# Patient Record
Sex: Male | Born: 1935 | Race: White | Hispanic: No | Marital: Married
Health system: Southern US, Community
[De-identification: ages and names within clinical notes are randomized; demographics above are authoritative.]

---

## 2004-08-03 ENCOUNTER — Ambulatory Visit: Payer: Self-pay | Admitting: Unknown Physician Specialty

## 2007-05-28 ENCOUNTER — Ambulatory Visit: Payer: Self-pay | Admitting: Unknown Physician Specialty

## 2010-11-06 ENCOUNTER — Inpatient Hospital Stay: Payer: Self-pay | Admitting: Internal Medicine

## 2013-03-05 ENCOUNTER — Ambulatory Visit: Payer: Self-pay | Admitting: Family Medicine

## 2013-03-05 LAB — URINALYSIS, COMPLETE
Bilirubin,UR: NEGATIVE
Ketone: NEGATIVE
Nitrite: NEGATIVE
Ph: 6.5 (ref 4.5–8.0)
Protein: 300
Specific Gravity: 1.02 (ref 1.003–1.030)

## 2013-03-05 LAB — COMPREHENSIVE METABOLIC PANEL
Albumin: 2.9 g/dL — ABNORMAL LOW (ref 3.4–5.0)
Alkaline Phosphatase: 71 U/L (ref 50–136)
Anion Gap: 10 (ref 7–16)
BUN: 19 mg/dL — ABNORMAL HIGH (ref 7–18)
Bilirubin,Total: 0.5 mg/dL (ref 0.2–1.0)
Calcium, Total: 8.9 mg/dL (ref 8.5–10.1)
Chloride: 104 mmol/L (ref 98–107)
Co2: 25 mmol/L (ref 21–32)
Creatinine: 1.17 mg/dL (ref 0.60–1.30)
EGFR (African American): >60
EGFR (Non-African Amer.): 56 — ABNORMAL LOW
Glucose: 415 mg/dL — ABNORMAL HIGH (ref 65–99)
Osmolality: 297 (ref 275–301)
Potassium: 4 mmol/L (ref 3.5–5.1)
SGOT(AST): 13 U/L — ABNORMAL LOW (ref 15–37)
SGPT (ALT): 19 U/L (ref 12–78)
Sodium: 139 mmol/L (ref 136–145)
Total Protein: 5.8 g/dL — ABNORMAL LOW (ref 6.4–8.2)

## 2013-03-05 LAB — CBC WITH DIFFERENTIAL/PLATELET
Basophil #: 0 10*3/uL (ref 0.0–0.1)
Basophil %: 0.9 %
Eosinophil #: 0.1 10*3/uL (ref 0.0–0.7)
Eosinophil %: 2.2 %
HCT: 32.2 % — ABNORMAL LOW (ref 40.0–52.0)
HGB: 10.8 g/dL — ABNORMAL LOW (ref 13.0–18.0)
Lymphocyte #: 0.7 10*3/uL — ABNORMAL LOW (ref 1.0–3.6)
Lymphocyte %: 12.8 %
MCH: 29.8 pg (ref 26.0–34.0)
MCHC: 33.4 g/dL (ref 32.0–36.0)
MCV: 89 fL (ref 80–100)
Monocyte #: 0.4 x10 3/mm (ref 0.2–1.0)
Monocyte %: 8.3 %
Neutrophil #: 3.9 10*3/uL (ref 1.4–6.5)
Neutrophil %: 75.8 %
Platelet: 238 10*3/uL (ref 150–440)
RBC: 3.61 10*6/uL — ABNORMAL LOW (ref 4.40–5.90)
RDW: 15.8 % — ABNORMAL HIGH (ref 11.5–14.5)
WBC: 5.2 10*3/uL (ref 3.8–10.6)

## 2013-03-07 ENCOUNTER — Ambulatory Visit: Payer: Self-pay | Admitting: Emergency Medicine

## 2013-03-07 LAB — URINE CULTURE

## 2013-03-07 LAB — URINALYSIS, COMPLETE
Bilirubin,UR: NEGATIVE
Nitrite: NEGATIVE

## 2013-03-08 ENCOUNTER — Ambulatory Visit: Payer: Self-pay

## 2013-03-09 LAB — URINE CULTURE

## 2013-04-30 ENCOUNTER — Inpatient Hospital Stay: Payer: Self-pay | Admitting: Internal Medicine

## 2013-04-30 ENCOUNTER — Ambulatory Visit: Payer: Self-pay | Admitting: Family Medicine

## 2013-04-30 LAB — BASIC METABOLIC PANEL
Anion Gap: 19 — ABNORMAL HIGH (ref 7–16)
Anion Gap: 14 (ref 7–16)
BUN: 39 mg/dL — ABNORMAL HIGH (ref 7–18)
BUN: 35 mg/dL — ABNORMAL HIGH (ref 7–18)
Chloride: 95 mmol/L — ABNORMAL LOW (ref 98–107)
Chloride: 103 mmol/L (ref 98–107)
Co2: 18 mmol/L — ABNORMAL LOW (ref 21–32)
Co2: 19 mmol/L — ABNORMAL LOW (ref 21–32)
Creatinine: 1.54 mg/dL — ABNORMAL HIGH (ref 0.60–1.30)
Creatinine: 1.07 mg/dL (ref 0.60–1.30)
EGFR (African American): 47 — ABNORMAL LOW
EGFR (Non-African Amer.): 40 — ABNORMAL LOW
EGFR (Non-African Amer.): >60
Glucose: 637 mg/dL (ref 65–99)
Glucose: 328 mg/dL — ABNORMAL HIGH (ref 65–99)
Osmolality: 304 (ref 275–301)
Osmolality: 293 (ref 275–301)
Potassium: 5.5 mmol/L — ABNORMAL HIGH (ref 3.5–5.1)
Potassium: 4.1 mmol/L (ref 3.5–5.1)
Sodium: 132 mmol/L — ABNORMAL LOW (ref 136–145)
Sodium: 136 mmol/L (ref 136–145)

## 2013-04-30 LAB — CBC WITH DIFFERENTIAL/PLATELET
Basophil #: 0 10*3/uL (ref 0.0–0.1)
Eosinophil %: 0.1 %
HCT: 41.6 % (ref 40.0–52.0)
HGB: 13 g/dL (ref 13.0–18.0)
Lymphocyte #: 0.6 10*3/uL — ABNORMAL LOW (ref 1.0–3.6)
MCH: 28.5 pg (ref 26.0–34.0)
MCHC: 31.3 g/dL — ABNORMAL LOW (ref 32.0–36.0)
MCV: 91 fL (ref 80–100)
Monocyte #: 0.7 x10 3/mm (ref 0.2–1.0)
Platelet: 266 10*3/uL (ref 150–440)
RBC: 4.57 10*6/uL (ref 4.40–5.90)
RDW: 15.5 % — ABNORMAL HIGH (ref 11.5–14.5)
WBC: 6.4 10*3/uL (ref 3.8–10.6)

## 2013-04-30 LAB — URINALYSIS, COMPLETE
Bilirubin,UR: NEGATIVE
Nitrite: NEGATIVE
Protein: 30
RBC,UR: >30 /HPF (ref 0–5)
Specific Gravity: 1.015 (ref 1.003–1.030)

## 2013-04-30 LAB — CK TOTAL AND CKMB (NOT AT ARMC)
CK, Total: 27 U/L — ABNORMAL LOW (ref 35–232)
CK-MB: 1.9 ng/mL (ref 0.5–3.6)

## 2013-05-01 LAB — CBC WITH DIFFERENTIAL/PLATELET
Basophil %: 0.3 %
Eosinophil %: 1 %
MCHC: 34 g/dL (ref 32.0–36.0)
MCV: 86 fL (ref 80–100)
Monocyte #: 0.7 x10 3/mm (ref 0.2–1.0)
Monocyte %: 11.1 %
Neutrophil %: 78.7 %
Platelet: 260 10*3/uL (ref 150–440)
RBC: 3.88 10*6/uL — ABNORMAL LOW (ref 4.40–5.90)
RDW: 14.9 % — ABNORMAL HIGH (ref 11.5–14.5)
WBC: 6.4 10*3/uL (ref 3.8–10.6)

## 2013-05-01 LAB — BASIC METABOLIC PANEL
BUN: 31 mg/dL — ABNORMAL HIGH (ref 7–18)
Calcium, Total: 8.6 mg/dL (ref 8.5–10.1)
Chloride: 107 mmol/L (ref 98–107)
Co2: 27 mmol/L (ref 21–32)
Creatinine: 1.04 mg/dL (ref 0.60–1.30)
EGFR (African American): >60
EGFR (Non-African Amer.): >60
Osmolality: 289 (ref 275–301)
Sodium: 140 mmol/L (ref 136–145)

## 2013-05-01 LAB — MAGNESIUM: Magnesium: 1.8 mg/dL

## 2013-05-01 LAB — LIPID PANEL
Cholesterol: 138 mg/dL (ref 0–200)
Ldl Cholesterol, Calc: 83 mg/dL (ref 0–100)

## 2013-05-01 LAB — TSH: Thyroid Stimulating Horm: 1.12 u[IU]/mL

## 2013-05-01 LAB — HEMOGLOBIN A1C: Hemoglobin A1C: 13.5 % — ABNORMAL HIGH (ref 4.2–6.3)

## 2013-05-02 LAB — URINALYSIS, COMPLETE
Bilirubin,UR: NEGATIVE
Glucose,UR: 150 mg/dL (ref 0–75)
Nitrite: NEGATIVE
Ph: 6 (ref 4.5–8.0)
Protein: 100
RBC,UR: 388 /HPF (ref 0–5)
Specific Gravity: 1.014 (ref 1.003–1.030)
Squamous Epithelial: NONE SEEN
WBC UR: 13476 /HPF (ref 0–5)

## 2013-05-04 LAB — URINE CULTURE

## 2013-05-05 ENCOUNTER — Inpatient Hospital Stay: Payer: Self-pay | Admitting: Internal Medicine

## 2013-05-05 LAB — CK-MB: CK-MB: 12.8 ng/mL — ABNORMAL HIGH (ref 0.5–3.6)

## 2013-05-05 LAB — URINALYSIS, COMPLETE
Bacteria: NONE SEEN
Glucose,UR: 150 mg/dL (ref 0–75)
RBC,UR: 368 /HPF (ref 0–5)
Specific Gravity: 1.008 (ref 1.003–1.030)

## 2013-05-05 LAB — TROPONIN I: Troponin-I: 0.08 ng/mL — ABNORMAL HIGH

## 2013-05-05 LAB — COMPREHENSIVE METABOLIC PANEL
Albumin: 2.3 g/dL — ABNORMAL LOW (ref 3.4–5.0)
Alkaline Phosphatase: 85 U/L (ref 50–136)
BUN: 19 mg/dL — ABNORMAL HIGH (ref 7–18)
Bilirubin,Total: 0.4 mg/dL (ref 0.2–1.0)
Calcium, Total: 8.9 mg/dL (ref 8.5–10.1)
Chloride: 99 mmol/L (ref 98–107)
Creatinine: 0.67 mg/dL (ref 0.60–1.30)
EGFR (African American): >60
EGFR (Non-African Amer.): >60
Glucose: 158 mg/dL — ABNORMAL HIGH (ref 65–99)
Total Protein: 5.8 g/dL — ABNORMAL LOW (ref 6.4–8.2)

## 2013-05-05 LAB — CBC
HGB: 12.4 g/dL — ABNORMAL LOW (ref 13.0–18.0)
MCV: 84 fL (ref 80–100)
Platelet: 235 10*3/uL (ref 150–440)
RBC: 4.43 10*6/uL (ref 4.40–5.90)
RDW: 15.5 % — ABNORMAL HIGH (ref 11.5–14.5)

## 2013-05-05 LAB — CK TOTAL AND CKMB (NOT AT ARMC)
CK, Total: 614 U/L — ABNORMAL HIGH (ref 35–232)
CK-MB: 7.4 ng/mL — ABNORMAL HIGH (ref 0.5–3.6)

## 2013-05-06 LAB — TROPONIN I: Troponin-I: 0.1 ng/mL — ABNORMAL HIGH

## 2013-05-06 LAB — CBC WITH DIFFERENTIAL/PLATELET
Basophil #: 0 10*3/uL (ref 0.0–0.1)
Basophil %: 0.5 %
Eosinophil #: 0 10*3/uL (ref 0.0–0.7)
HCT: 30.1 % — ABNORMAL LOW (ref 40.0–52.0)
HGB: 10.1 g/dL — ABNORMAL LOW (ref 13.0–18.0)
Lymphocyte #: 0.8 10*3/uL — ABNORMAL LOW (ref 1.0–3.6)
Lymphocyte %: 14.1 %
MCHC: 33.5 g/dL (ref 32.0–36.0)
MCV: 85 fL (ref 80–100)
Monocyte #: 0.6 x10 3/mm (ref 0.2–1.0)
Monocyte %: 11.9 %
Neutrophil #: 4 10*3/uL (ref 1.4–6.5)
Platelet: 193 10*3/uL (ref 150–440)
RBC: 3.57 10*6/uL — ABNORMAL LOW (ref 4.40–5.90)
WBC: 5.5 10*3/uL (ref 3.8–10.6)

## 2013-05-06 LAB — CK-MB: CK-MB: 4.7 ng/mL — ABNORMAL HIGH (ref 0.5–3.6)

## 2013-05-06 LAB — BASIC METABOLIC PANEL
Anion Gap: 6 — ABNORMAL LOW (ref 7–16)
BUN: 16 mg/dL (ref 7–18)
Calcium, Total: 8.2 mg/dL — ABNORMAL LOW (ref 8.5–10.1)
Chloride: 104 mmol/L (ref 98–107)
Creatinine: 0.74 mg/dL (ref 0.60–1.30)
EGFR (African American): >60
Glucose: 111 mg/dL — ABNORMAL HIGH (ref 65–99)

## 2013-05-07 LAB — URINE CULTURE

## 2013-05-10 LAB — CULTURE, BLOOD (SINGLE)

## 2013-06-01 ENCOUNTER — Ambulatory Visit: Payer: Self-pay | Admitting: Urology

## 2013-06-04 ENCOUNTER — Ambulatory Visit: Payer: Self-pay | Admitting: Urology

## 2013-09-09 IMAGING — CT CT HEAD WITHOUT CONTRAST
2 of 3 series · 16 of 30 positions shown, 18 images · non-contrast
Comparison: none

REASON FOR EXAM: syncope ha
COMMENTS:

[Series 4: soft tissue 2 · axial · 0.40mm/px · z∈[+582,+711]mm · 8 of 34 slices shown, 10 images]
[im 4/34  brain]
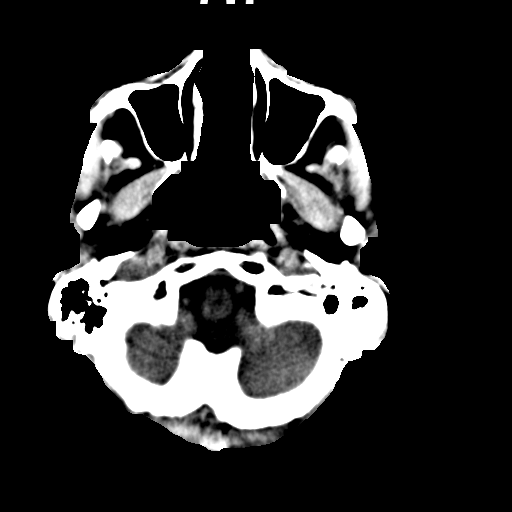
[im 4/34  bone]
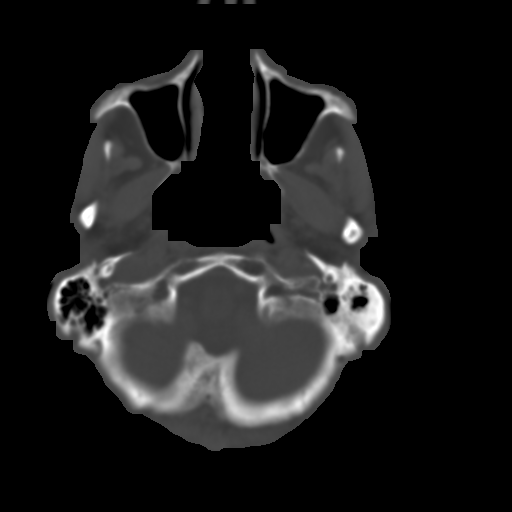
[im 8/34  brain]
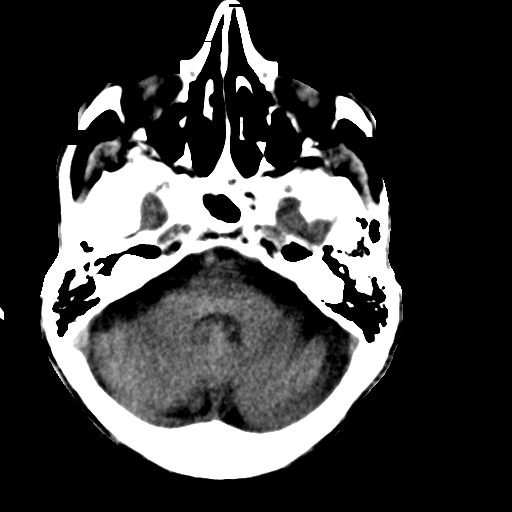
[im 12/34  brain]
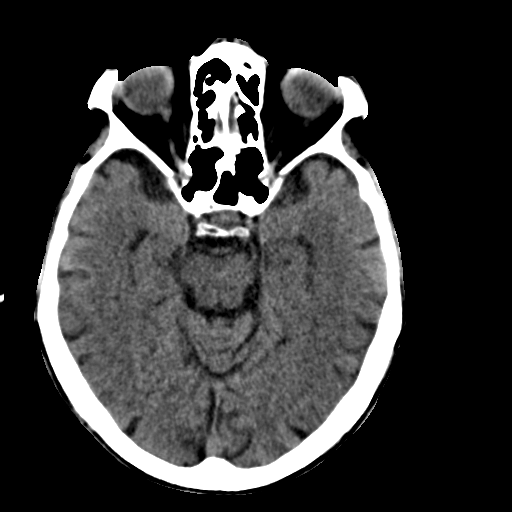
[im 15/34  brain]
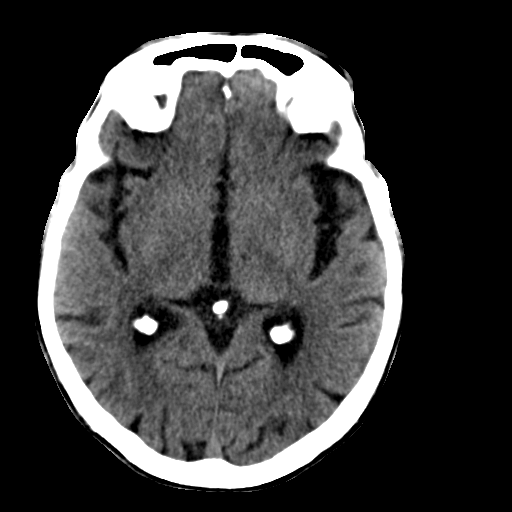
[im 19/34  brain]
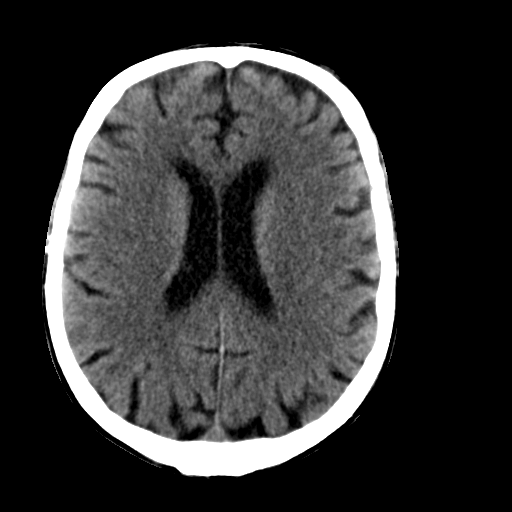
[im 19/34  bone]
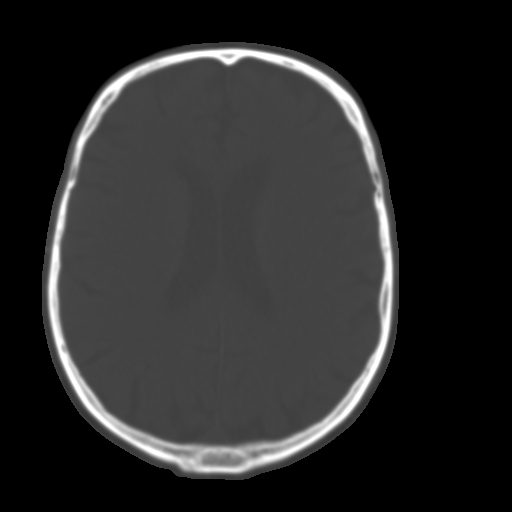
[im 23/34  brain]
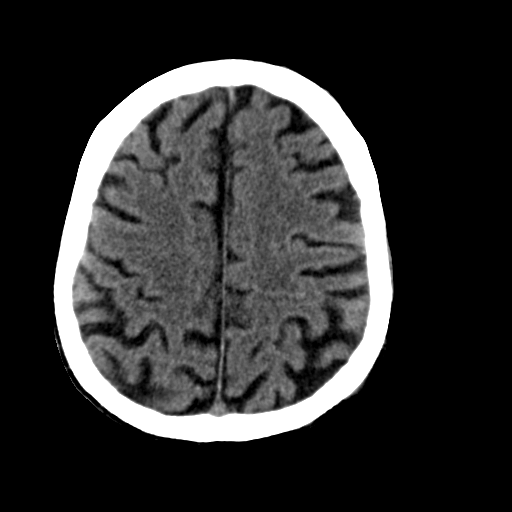
[im 26/34  brain]
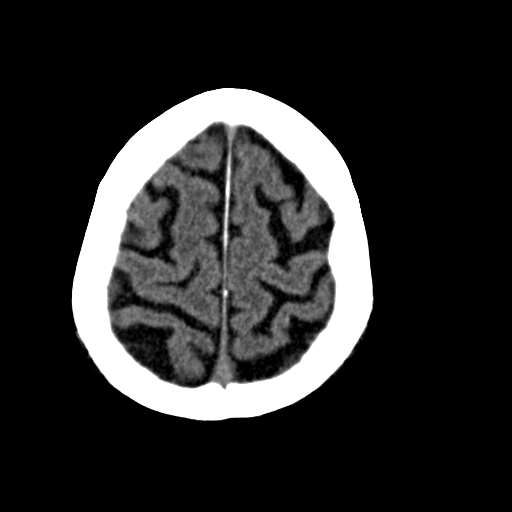
[im 30/34  brain]
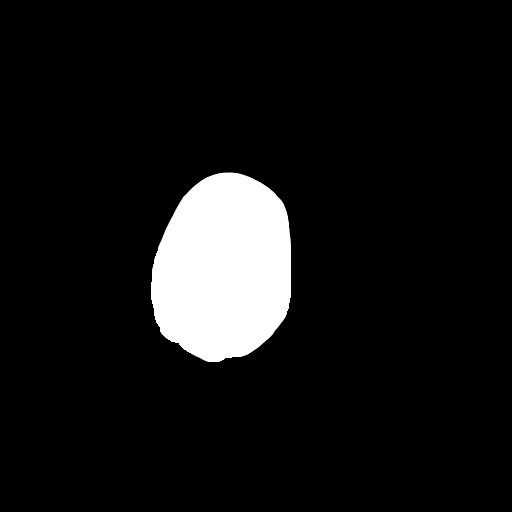

[Series 5: bone 2 · axial · 0.40mm/px · z∈[+565,+704]mm · 8 of 36 slices shown]
[im 4/36  bone]
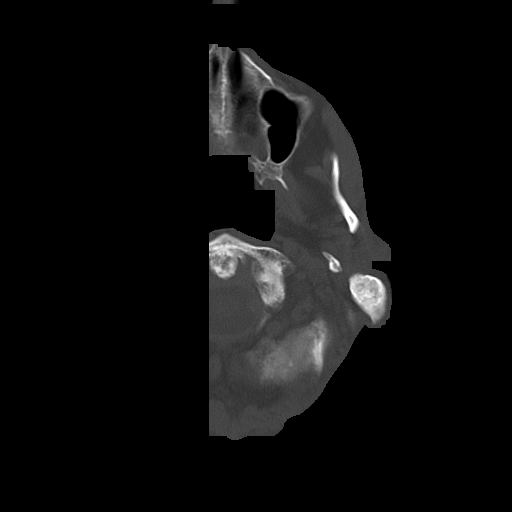
[im 8/36  bone]
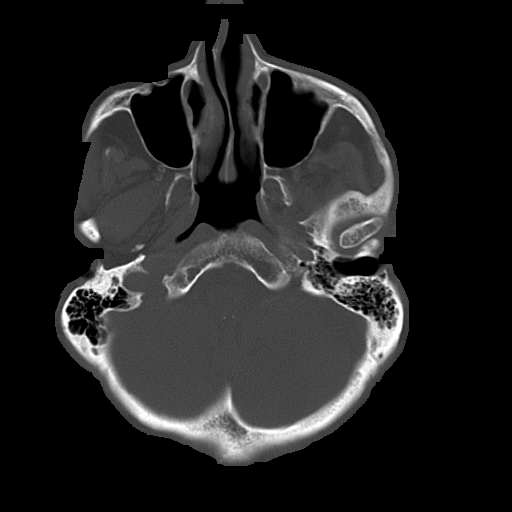
[im 12/36  bone]
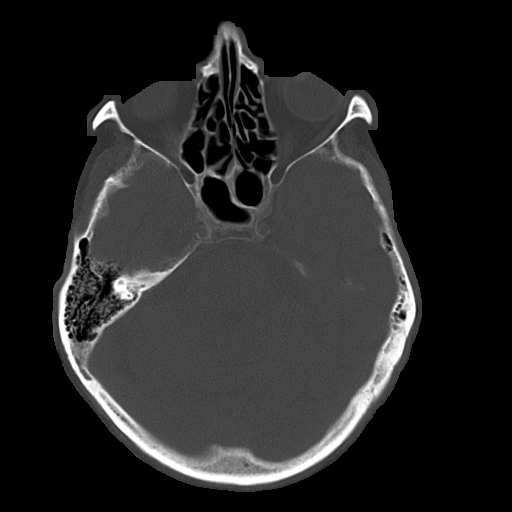
[im 16/36  bone]
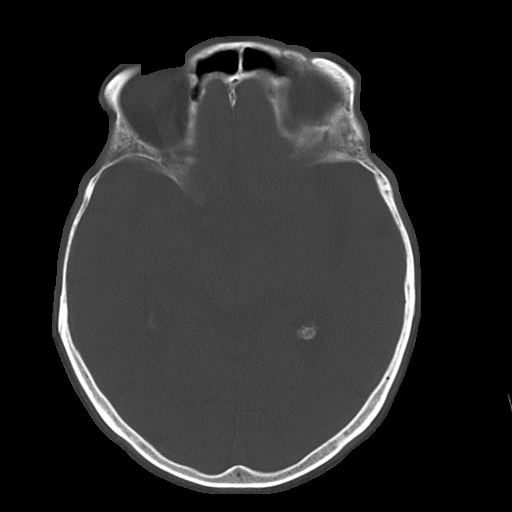
[im 20/36  bone]
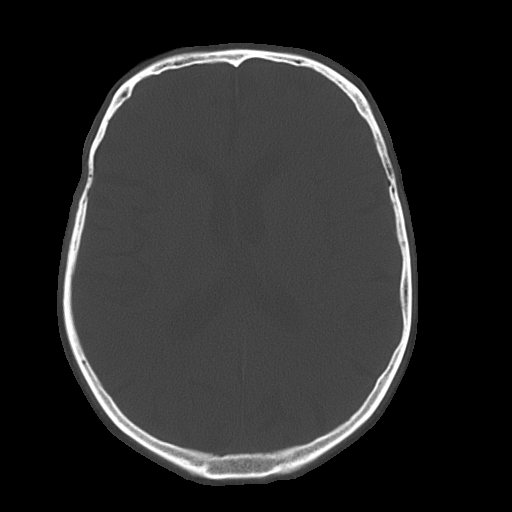
[im 24/36  bone]
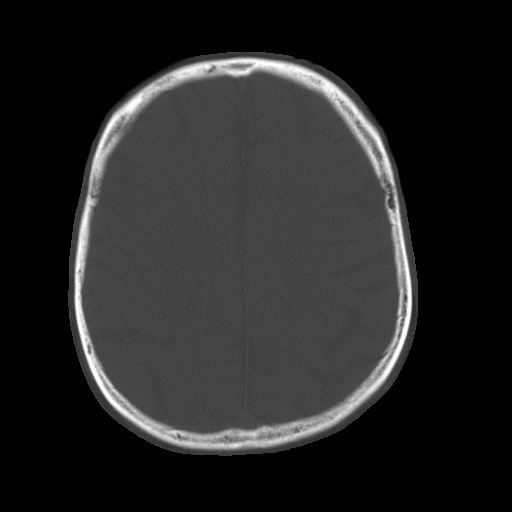
[im 28/36  bone]
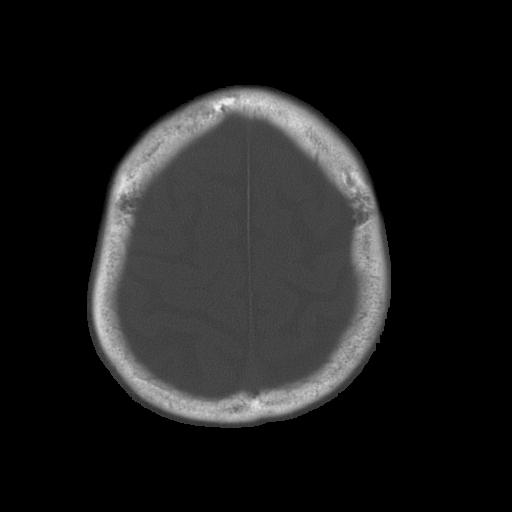
[im 32/36  bone]
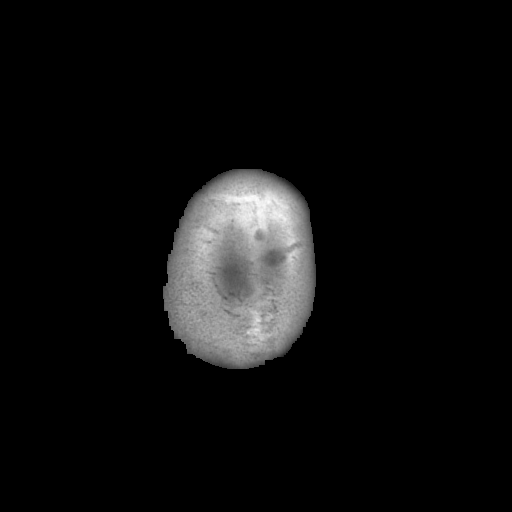

[16 of 30 positions shown; findings below may reference images not displayed]

PROCEDURE:     CT  - CT HEAD WITHOUT CONTRAST  - May 05, 2013  [DATE]

RESULT:     Noncontrast CT of the brain is performed in the standard
fashion. The patient has no previous exam for comparison.

There is low-attenuation in the periventricular and subcortical white matter
regions. There is mild prominence of the ventricles and sulci within normal
limits for the patient's age. The study demonstrates no evidence of
hemorrhage, mass, mass effect or midline shift. No evolving infarct is
appreciated at this time. The paranasal sinuses and mastoid air cells
included on the study show grossly normal aeration. The calvarium is intact
without a depressed skull fracture.
IMPRESSION: 1. Atrophy with chronic microvascular ischemic disease. No acute
intracranial abnormality demonstrated. Some low-attenuation areas in the
basal ganglia could represent tiny lacunar infarcts or prominent
perivascular spaces.

[REDACTED]

## 2013-09-09 IMAGING — US US RENAL KIDNEY
1 series · 14 of 25 positions shown · non-contrast
Comparison: none

REASON FOR EXAM: recurrent uti
COMMENTS:

[Series 1: us renal kidney · 0.21mm/px · 14 of 42 slices shown]
[im 1/42]
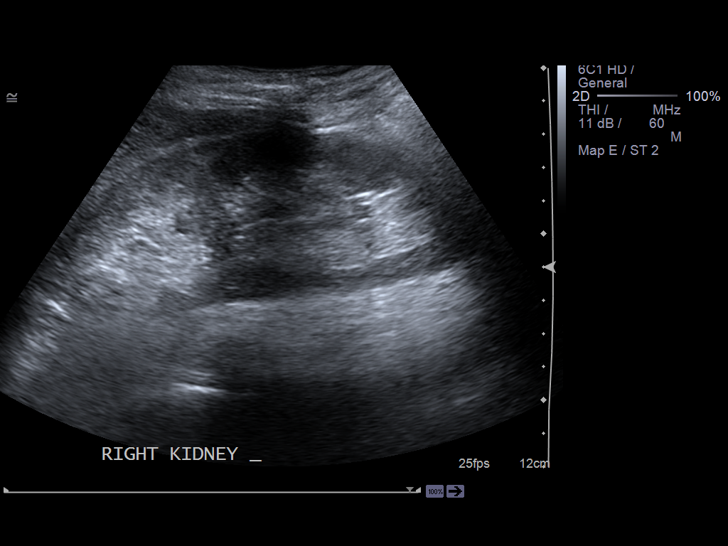
[im 4/42]
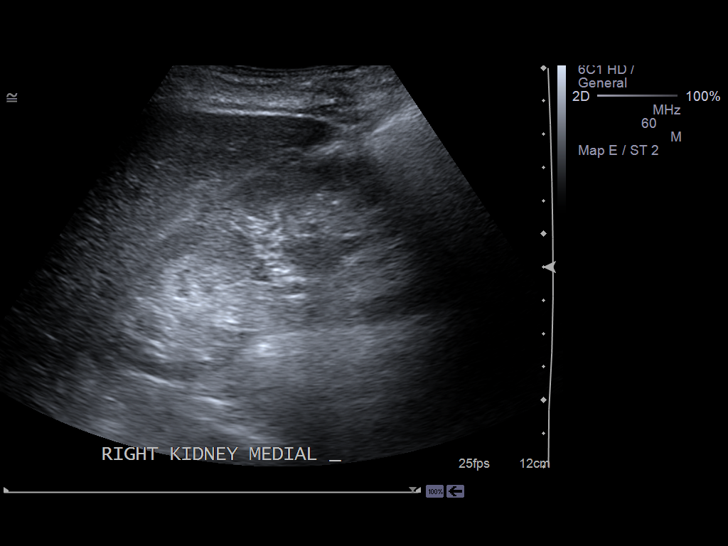
[im 7/42]
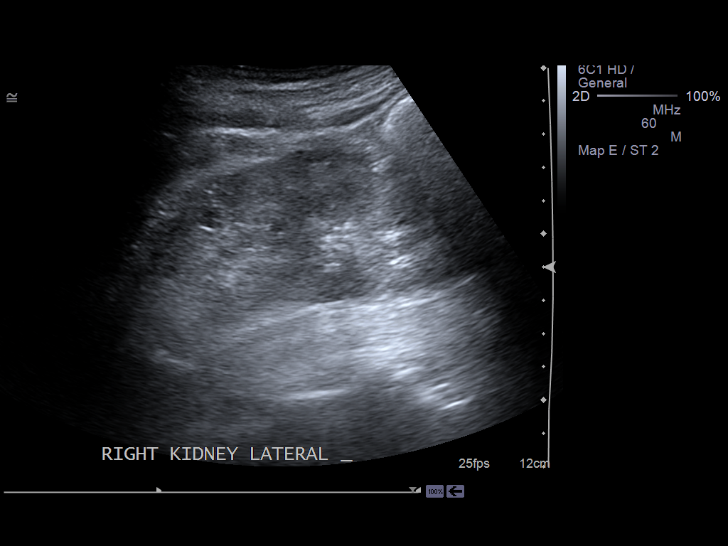
[im 11/42]
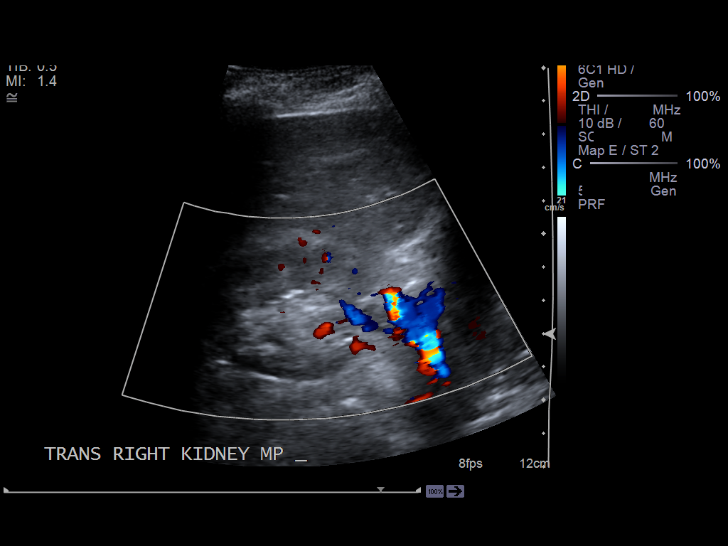
[im 14/42]
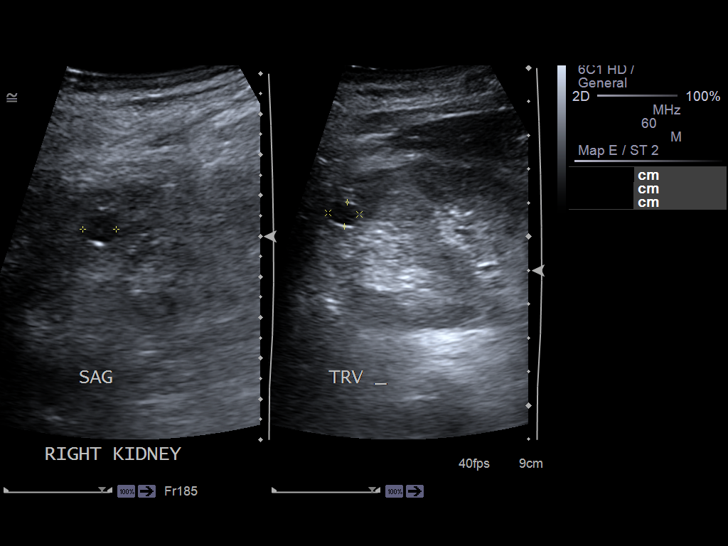
[im 16/42]
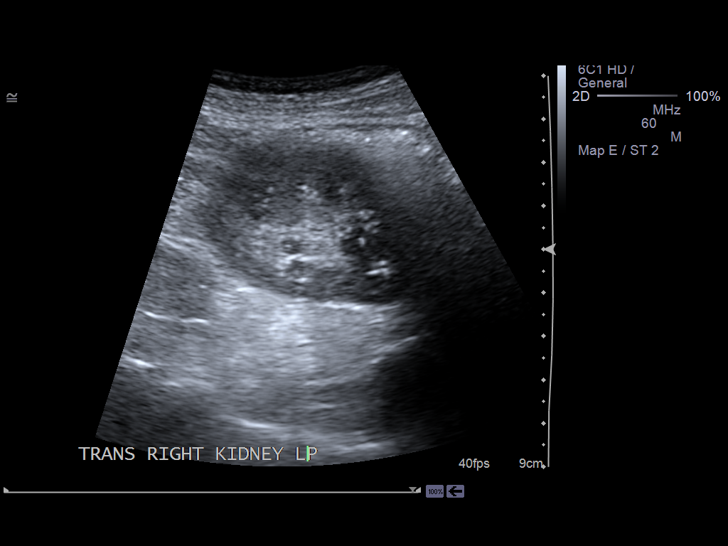
[im 19/42]
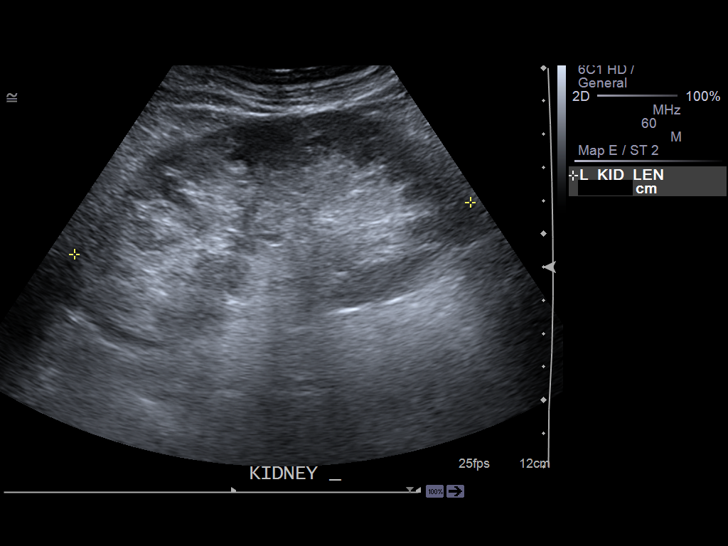
[im 23/42]
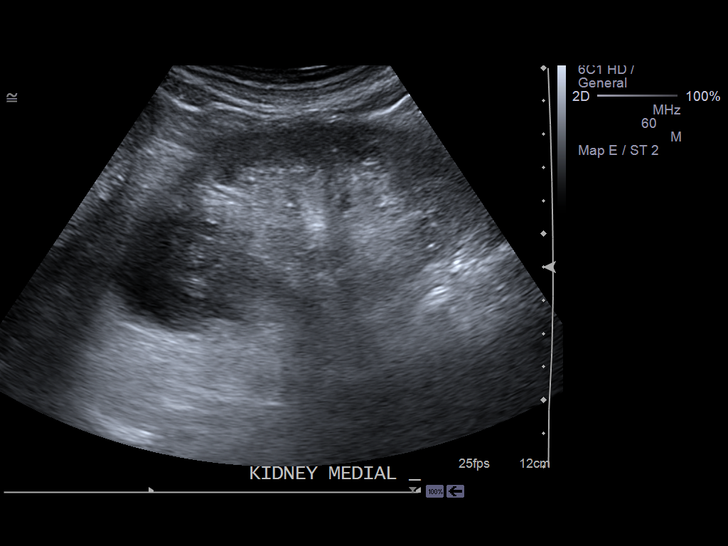
[im 26/42]
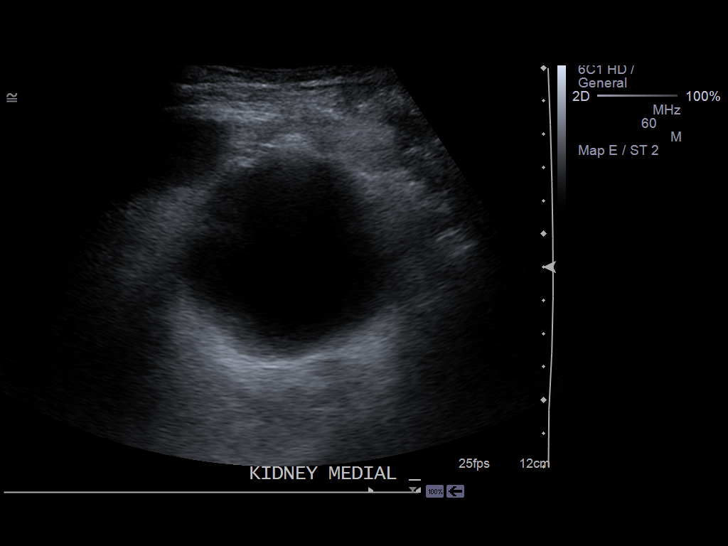
[im 28/42]
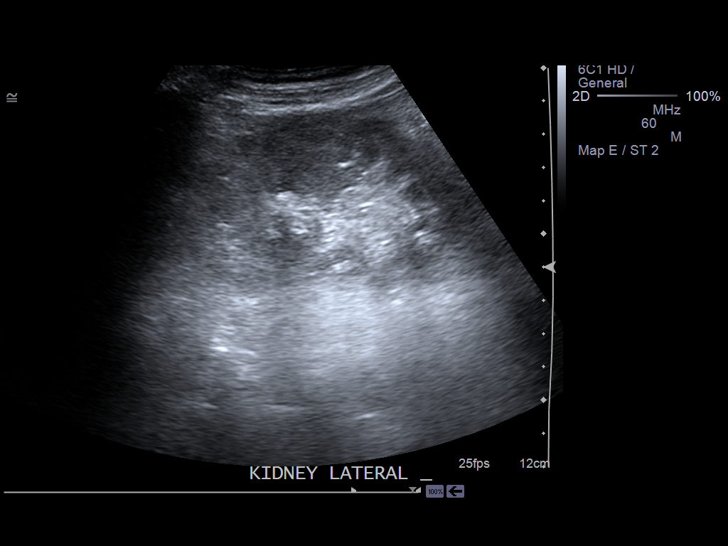
[im 31/42]
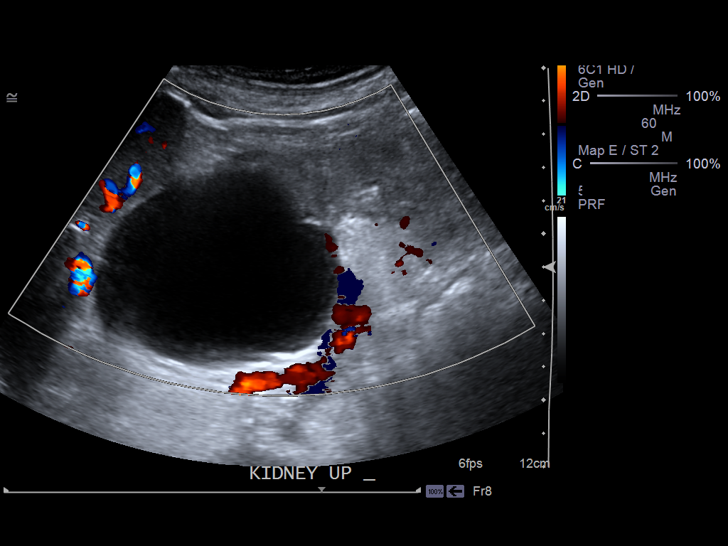
[im 35/42]
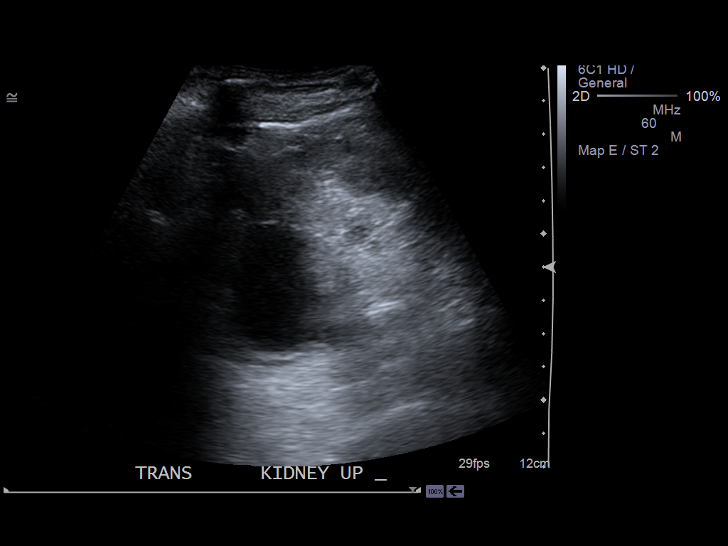
[im 38/42]
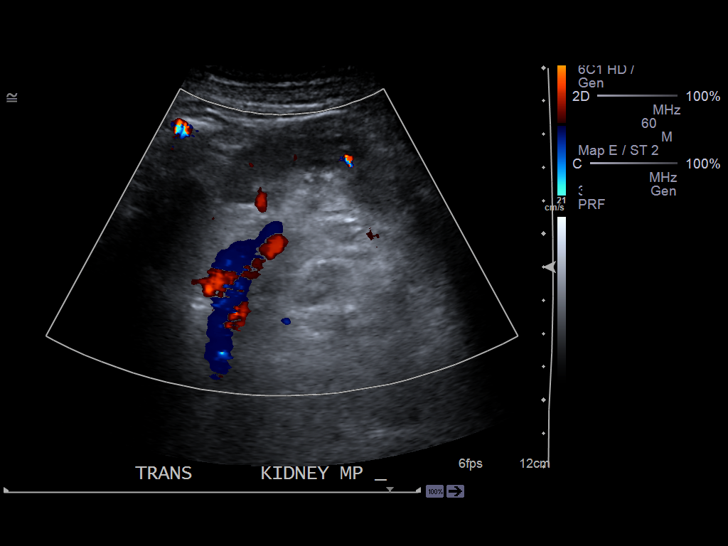
[im 42/42]
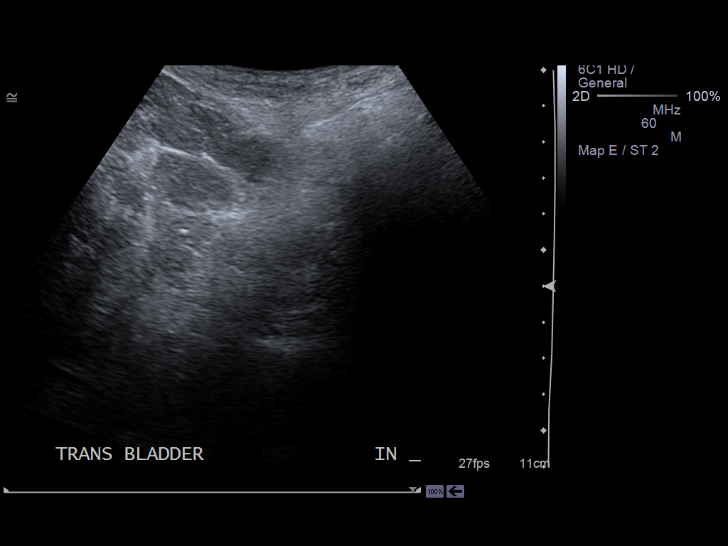

[14 of 25 positions shown; findings below may reference images not displayed]

PROCEDURE:     US  - US KIDNEY  - May 05, 2013 [DATE]

RESULT:     Comparison: None

Technique and findings: Multiple gray-scale and color doppler images of the
kidneys were obtained.

The right kidney measures 13.2 x 4.9 x 4.4 cm and the left kidney measures
12 x 4.5 x 6.3 cm. The kidneys are normal in echogenicity. There is no
hydronephrosis.  There bilateral anechoic renal masses with the largest on
the right measuring 8 x 9 x 7 mm and the largest in the left kidney
measuring 7.3 x 6.2 x 6.4 cm, most consistent with cysts. There multiple
bilateral nephrolithiasis versus arterial calcifications. There is no free
fluid in the region of the renal fossa.
IMPRESSION: 1. No obstructive uropathy.

2. Bilateral renal cysts.

3. Nephrolithiasis versus arterial calcifications bilaterally.

[REDACTED]

## 2013-11-26 ENCOUNTER — Ambulatory Visit: Payer: Self-pay | Admitting: Internal Medicine

## 2014-09-15 ENCOUNTER — Ambulatory Visit: Payer: Self-pay | Admitting: Family Medicine

## 2014-11-01 DEATH — deceased

## 2014-12-23 NOTE — Consult Note (Signed)
PATIENT NAME:  Carlos Gordon, Carlos Gordon MR#:  [id number] DATE OF BIRTH:  Jan 10, 1924  DATE OF CONSULTATION:  05/05/2013  REFERRING PHYSICIAN:  Auburn BilberryShreyang Patel, MD CONSULTING PHYSICIAN:  Caralyn Guileichard D. Edwyna ShellHart, DO  HISTORY OF PRESENT ILLNESS: Mr. Carlos Gordon is an 81 year old Caucasian male who was  brought in for confusion and sugar problems. He states that he goes about 6 or 7 times a day and several times at night. His glucoses have been running between 255, 193 and 121. Creatinine 0.6.   Apparently he has had some UTIs that have not been resolved.   Previous urologic history includes a TURP by Dr. Leonette MonarchHarman many years ago. He claims he has no problem with his voiding as far as being able to void. No hesitancy, urgency, dysuria, pyuria.   What is good to see is his creatinine is normal. His AST is slightly elevated. His troponins are elevated. His white count is 8300, hemoglobin 12.4. He is spilling some glucose in his urine. He does have microhematuria and white count but no bacteria.   I do not see any CT scan on the chart at this time. But since the patient is having recurrent UTIs, I recommend 2 things from the urologic viewpoint, and they are a CT scan without contrast, although his creatinine is fine, and he can use contrast if he so desires, and a cystoscopy, which I can do in my office.   ALLERGIES: PAST MEDICAL HISTORY OTHERWISE SHOWS THAT HE IS ALLERGIC TO ATIVAN AND PENICILLIN.   MEDICATIONS: Lantus insulin, aspirin, iron sulfate, Keflex t.i.d., Protonix.    PAST MEDICAL HISTORY: Shows diabetes, type 2; history of iron deficiency anemia; hiatal hernia; diverticulosis; bowel obstruction with perforation, 2012; hyperlipidemia; BPH.   PAST SURGICAL HISTORY: He has had a laparotomy for the obstruction and perforation of his bowel.   FAMILY HISTORY: Sister with GI cancer. Parents died of old age.   PHYSICAL EXAMINATION: GENERAL: Alert and oriented x3.  LUNGS: Clear to auscultation.  HEART: Heart sounds  are regular rate and rhythm.  EXTREMITIES: No clubbing, cyanosis, edema.  HEENT: Mouth shows some caries with his teeth. Lips are dry.  NECK: Supple. No supraclavicular, axillary, or groin lymphadenopathy.  GENITOURINARY: Penis is circumcised. Testes are normal. Prostate exam reveals a slightly enlarged prostate but does not feel nodular. It may be, but I do not think it is nodular. It is not asymmetric. At his age, I do not feel a PSA is necessary.   He came in with diabetic ketoacidosis, hyperglycemia, hypothermia with sepsis. His urinary tract infections show some fungal characteristic cultures, and he is going to be treated with IV fluconazole. Many times these infections that are fungal need to have a CT scan. This is to make sure we do not have either a superinfection or some kind of fungal infection in the kidney.  That would necessitate long-term fungal therapy.   So I will follow this patient because it is interesting that he has a fungal infection, and I will talk with you about him as time goes by, but definitely we need obviously a CAT scan to make sure we do not have any fungal balls sitting in the kidneys. I would utilize some contrast for this.   ____________________________ Caralyn Guileichard D. Edwyna ShellHart, DO [id number] D: 05/05/2013 18:03:25 ET T: 05/05/2013 19:12:10 ET JOB#: [id number]  cc: Caralyn Guileichard D. Edwyna ShellHart, DO, <Dictator> Ilse Billman D Binh Doten DO ELECTRONICALLY SIGNED 06/04/2013 13:33

## 2014-12-23 NOTE — Consult Note (Signed)
CHIEF COMPLAINT and HISTORY:  Subjective/Chief Complaint aortic dissection/AAA   History of Present Illness Patient admitted with hypoglycemia, AMS and had recent admission for similar issues.   On this admission had a CT scan which I have independently reviewed.  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  This is not symptomatic and appears chronic.   PAST MEDICAL/SURGICAL HISTORY:  Past Medical History:   MI - Myocardial Infarct:    Diabetes:   ALLERGIES:  Allergies:  Ativan: Other  Penicillin: Rash  HOME MEDICATIONS:  Home Medications: Medication Instructions Status  insulin glargine 100 units/mL subcutaneous solution 20  subcutaneous once a day (at bedtime) Active  insulin aspart-insulin aspart protamine subcutaneous 4 times a day (before meals and at bedtime), As Needed Active  AllanTex oral tablet 1 tab(s) orally once a day Active  aspirin 81 mg oral tablet 1 tab(s) orally once a day Active  ferrous sulfate 325 mg oral tablet 1 tab(s) orally 3 times a day Active  pantoprazole 20 mg oral enteric coated tablet 1 tab(s) orally once a day Active  Vitamin B12 1 tab(s) orally once a day Active  fluconazole 1 tab(s) orally once a day Active   Family and Social History:  Family History parents died of old age   Social History positive tobacco (Greater than 1 year), negative ETOH, negative Illicit drugs   Place of Living Home   Review of Systems:  Fever/Chills No   Cough No   Sputum No   Abdominal Pain No   Diarrhea No   Constipation No   Nausea/Vomiting No   SOB/DOE No   Chest Pain No   Telemetry Reviewed NSR   Dysuria Yes   Tolerating PT Yes   Tolerating Diet Yes   Medications/Allergies Reviewed Medications/Allergies reviewed   Physical Exam:  GEN well developed, thin   HEENT pink conjunctivae, moist oral mucosa   NECK No masses  trachea  midline   RESP normal resp effort  clear BS   CARD regular rate  no JVD   VASCULAR ACCESS none   ABD denies tenderness  normal BS   GU foley catheter in place  clear yellow urine draining   LYMPH negative neck, negative axillae   EXTR negative cyanosis/clubbing, negative edema   SKIN normal to palpation, skin turgor good   NEURO cranial nerves intact, motor/sensory function intact   PSYCH alert, A+O to time, place, person   LABS:  Laboratory Results: Thyroid:    03-Sep-14 07:57, Thyroid Stimulating Hormone  Thyroid Stimulating Hormone 3.17  0.45-4.50  (International Unit)   -----------------------  Pregnant patients have   different reference   ranges for TSH:   - - - - - - - - - -   Pregnant, first trimetser:   0.36 - 2.50 uIU/mL  LabObservation:    03-Sep-14 13:17, Echo Doppler  OBSERVATION   Reason for Test  Hepatic:    03-Sep-14 07:57, Comprehensive Metabolic Panel  Bilirubin, Total 0.4  Alkaline Phosphatase 85  SGPT (ALT) 25  SGOT (AST) 49  Total Protein, Serum 5.8  Albumin, Serum 2.3  Routine Micro:    03-Sep-14 09:48, Blood Culture  Micro Text Report   BLOOD CULTURE    COMMENT                   NO GROWTH IN 18-24 HOURS     ANTIBIOTIC  Culture Comment  NO GROWTH IN 18-24 HOURS   Result(s) reported on 06 May 2013 at 10:00AM.    03-Sep-14 10:00, Blood Culture  Micro Text Report   BLOOD CULTURE    COMMENT                   NO GROWTH IN 18-24 HOURS     ANTIBIOTIC  Culture Comment   NO GROWTH IN 18-24 HOURS   Result(s) reported on 06 May 2013 at 10:00AM.    03-Sep-14 10:30, Urine Culture  Micro Text Report   URINE CULTURE    COMMENT                   NO GROWTH IN 8-12 HOURS     ANTIBIOTIC  Specimen Source   INDWELLING CATHETER  Culture Comment   NO GROWTH IN 8-12 HOURS   Result(s) reported on 06 May 2013 at 12:18PM.  Cardiology:    03-Sep-14 07:56, ED ECG  Ventricular Rate 86  Atrial Rate 86  P-R Interval 168  QRS Duration 104  QT  368  QTc 440  P Axis 38  R Axis 131  T Axis 100  ECG interpretation   Undetermined rhythm  Low voltage QRS  Left posterior fascicular block  Inferior infarct , age undetermined  Abnormal ECG  When compared with ECG of 30-Apr-2013 16:45,  Current undetermined rhythm precludes rhythm comparison, needs review  NonspecificT wave abnormality now evident in Inferior leads  ----------unconfirmed----------  Confirmed by OVERREAD, NOT ([id number]), editor PEARSON, BARBARA ([id number]) on 05/06/2013 1:36:29 PM  ED ECG     03-Sep-14 13:17, Echo Doppler  Echo Doppler   REASON FOR EXAM:      COMMENTS:       PROCEDURE: [facility] - ECHO DOPPLER COMPLETE(TRANSTHOR)  - May 05 2013  1:17PM     RESULT: Echocardiogram Report    Patient Name:   Carlos Gordon Date of Exam: 05/05/2013  Medical Rec #:  [id number]              Custom1:  Date of Birth:  09-07-1923          Height:       73.0 in  Patient Age:    81 years            Weight:       137.0 lb  Patient Gender: M                   BSA:          1.83 m??    Indications: Hypotension  Sonographer:    Sherrie Sport RDCS  Referring Phys: Dustin Flock, H    Sonographer Comments: Technically difficult study due to poor echo   windows and no apical window.    Summary:   1. Left ventricular ejection fraction, by visual estimation, is 30 to   35%.   2. Moderately decreased global left ventricular systolic function.   3. Mild mitral valve regurgitation.   4. Moderate to severe aortic valve stenosis.   5. Mildly increased left ventricular posterior wall thickness.   6. Mild tricuspid regurgitation.  2D AND M-MODE MEASUREMENTS (normal ranges within parentheses):  Left Ventricle:          Normal    Aorta/Left Atrium:  3.40 cm  Normal  IVSd (2D):      1.25 cm (0.7-1.1) Aortic Root (Mmode):         (2.4-3.7)  LVPWd (2D):  1.29 cm (0.7-1.1) AoV Cusp Separation: 0.80 cm (1.5-2.6)  LVIDd (2D):     3.64 cm (3.4-5.7)  LVIDs (2D):     2.85 cm           Aorta/LA:                   Normal  LV FS (2D):     21.7 %   (>25%)   Aortic Root (2D): 2.90 cm (2.4-3.7)  LV EF (2D):     44.8 %   (>50%)   AoV Cusp Exc:     0.80 cm (1.5-2.6)                                    Left Atrium (2D): 2.60 cm (1.9-4.0)                                      Right Ventricle:                                    RVd (2D):        2.31 cm  SPECTRAL DOPPLER ANALYSIS (where applicable):  LVOT Vmax:  LVOT VTI:  LVOT Diameter: 2.00 cm  Tricuspid Valve and PA/RV Systolic Pressure: TR Max Velocity: 1.81 m/s RA   Pressure: 5 mmHg RVSP/PASP: 18.1 mmHg  Pulmonic Valve:  PV Max Velocity: 2.04 m/s PV Max PG: 16.6 mmHg PV Mean PG:    PHYSICIAN INTERPRETATION:  Left Ventricle: The left ventricular internal cavity size was normal. LV   posterior wall thickness was mildly increased. Global LV systolic   function was moderately decreased. Left ventricular ejection fraction, by   visual estimation, is 30 to 35%.  Right Ventricle: Normal right ventricular size, wall thickness, and   systolic function. RV wall thickness is normal.  Left Atrium: The left atrium is normal in size and structure.  Right Atrium: The right atrium is normal in size and structure.  Pericardium: There is no evidence of pericardial effusion.  Mitral Valve: Mild mitral valve regurgitation is seen.  Tricuspid Valve: Mild tricuspid regurgitation is visualized. The   tricuspid regurgitant velocity is 1.81 m/s, and with an assumed right     atrial pressure of 5 mmHg, the estimated right ventricular systolic   pressure is normal at 18.1 mmHg.  Aortic Valve: The aortic valve is tricuspid. Moderate to severe aortic   stenosis is present.  Aorta: The aortic root and ascending aorta are structurally normal, with   no evidence of dilitation.  Shunts: There is no evidence of a patent foramen ovale. There is no   evidence of an atrial septal defect.    [id number] Serafina Royals MD  Electronically signed by [id number] Serafina Royals MD  Signature  Date/Time: 05/06/2013/12:40:00 PM    *** Final ***  IMPRESSION: .        Verified By: Corey Skains  ([facility]), M.D., MD  Routine Chem:    03-Sep-14 07:57, Comprehensive Metabolic Panel  Glucose, Serum 158  BUN 19  Creatinine (comp) 0.67  Sodium, Serum 132  Potassium, Serum 3.8  Chloride, Serum 99  CO2, Serum 25  Calcium (Total), Serum 8.9  Osmolality (calc) 270  eGFR (African American) >60  eGFR (Non-African American) >60  eGFR  values <63m/min/1.73 m2 may be an indication of chronic  kidney disease (CKD).  Calculated eGFR is useful in patients with stable renal function.  The eGFR calculation will not be reliable in acutely ill patients  when serum creatinine is changing rapidly. It is not useful in   patients on dialysis. The eGFR calculation may not be applicable  to patients at the low and high extremes of body sizes, pregnant  women, and vegetarians.  Anion Gap 8    03-Sep-14 07:57, Troponin I  Result Comment   TROPONIN - RESULTS VERIFIED BY REPEAT TESTING.   - C/TO AMY TEAGE [id number]/3/14..Marland KitchenDF   - READ-BACK PROCESS PERFORMED.   Result(s) reported on 05 May 2013 at 08:46AM.    03-Sep-14 14:34, Troponin I  Result Comment   TROPONIN - RESULTS VERIFIED BY REPEAT TESTING.   - PREVIOUSLY CALLED AT 0843 05/05/2013   - BY CDF. TFK   Result(s) reported on 05 May 2013 at 04:27PM.    03-Sep-14 23:19, Troponin I  Result Comment   TROPONIN - RESULTS VERIFIED BY REPEAT TESTING.   - PREVIOUS CALL:05/05/13 AT 0843..Marland KitchenPL   Result(s) reported on 06 May 2013 at 12:36AM.    04-Sep-14 099:35 Basic Metabolic Panel (w/Total Calcium)  Glucose, Serum 111  BUN 16  Creatinine (comp) 0.74  Sodium, Serum 136  Potassium, Serum 3.6  Chloride, Serum 104  CO2, Serum 26  Calcium (Total), Serum 8.2  Anion Gap 6  Osmolality (calc) 274  eGFR (African American) >60  eGFR (Non-African American) >60  eGFR values <652mmin/1.73 m2 may be an indication of chronic  kidney disease  (CKD).  Calculated eGFR is useful in patients with stable renal function.  The eGFR calculation will not be reliable in acutely ill patients  when serum creatinine is changing rapidly. It is not useful in   patients on dialysis. The eGFR calculation may not be applicable  to patients at the low and high extremes of body sizes, pregnant  women, and vegetarians.    04-Sep-14 05:51, Troponin I  Result Comment   TROPONIN - RESULTS VERIFIED BY REPEAT TESTING.   - PREVIOUSLY CALLED 05/05/13 @ 0843..CMarland KitchenF   Result(s) reported on 06 May 2013 at 06:42AM.  Cardiac:    03-Sep-14 07:57, Cardiac Panel  CK, Total 614  CPK-MB, Serum 7.4  Result(s) reported on 05 May 2013 at 08:59AM.    03-Sep-14 07:57, Troponin I  Troponin I 0.08  0.00-0.05  0.05 ng/mL or less: NEGATIVE   Repeat testing in 3-6 hrs   if clinically indicated.  >0.05 ng/mL: POTENTIAL   MYOCARDIAL INJURY. Repeat   testing in 3-6 hrs if   clinically indicated.  NOTE: An increase or decrease   of 30% or more on serial   testing suggests a   clinically important change    03-Sep-14 14:34, CPK-MB, Serum  CPK-MB, Serum 12.8  Result(s) reported on 05 May 2013 at 03:27PM.    03-Sep-14 14:34, Troponin I  Troponin I 0.07  0.00-0.05  0.05 ng/mL or less: NEGATIVE   Repeat testing in 3-6 hrs   if clinically indicated.  >0.05 ng/mL: POTENTIAL   MYOCARDIAL INJURY. Repeat   testing in 3-6 hrs if   clinically indicated.  NOTE: An increase or decrease   of 30% or more on serial   testing suggests a   clinically important change    03-Sep-14 23:19, CPK-MB, Serum  CPK-MB, Serum 7.8  Result(s) reported on 06 May 2013 at 12:19AM.    03-Sep-14  23:19, Troponin I  Troponin I 0.10  0.00-0.05  0.05 ng/mL or less: NEGATIVE   Repeat testing in 3-6 hrs   if clinically indicated.  >0.05 ng/mL: POTENTIAL   MYOCARDIAL INJURY. Repeat   testing in 3-6 hrs if   clinically indicated.  NOTE: An increase or decrease   of 30% or more on serial    testing suggests a   clinically important change    04-Sep-14 05:51, CPK-MB, Serum  CPK-MB, Serum 4.7  Result(s) reported on 06 May 2013 at 06:36AM.    04-Sep-14 05:51, Troponin I  Troponin I 0.10  0.00-0.05  0.05 ng/mL or less: NEGATIVE   Repeat testing in 3-6 hrs   if clinically indicated.  >0.05 ng/mL: POTENTIAL   MYOCARDIAL INJURY. Repeat   testing in 3-6 hrs if   clinically indicated.  NOTE: An increase or decrease   of 30% or more on serial   testing suggests a   clinically important change  Routine UA:    03-Sep-14 07:57, Urinalysis  Color (UA) Yellow  Clarity (UA) Cloudy  Glucose (UA)   150 mg/dL  Bilirubin (UA) Negative  Ketones (UA) Negative  Specific Gravity (UA) 1.008  Blood (UA) 3+  pH (UA) 7.0  Protein (UA) 30 mg/dL  Nitrite (UA) Negative  Leukocyte Esterase (UA) 3+  Result(s) reported on 05 May 2013 at 08:39AM.  RBC (UA) 368 /HPF  WBC (UA) 419 /HPF  Bacteria (UA)   NONE SEEN  Epithelial Cells (UA) 1 /HPF  WBC Clump (UA) PRESENT  Mucous (UA) PRESENT  Result(s) reported on 05 May 2013 at 08:39AM.  Routine Hem:    03-Sep-14 07:57, Hemogram, Platelet Count  WBC (CBC) 8.3  RBC (CBC) 4.43  Hemoglobin (CBC) 12.4  Hematocrit (CBC) 37.4  Platelet Count (CBC) 235  Result(s) reported on 05 May 2013 at 08:13AM.  MCV 84  MCH 28.1  MCHC 33.3  RDW 15.5    04-Sep-14 05:51, CBC Profile  WBC (CBC) 5.5  RBC (CBC) 3.57  Hemoglobin (CBC) 10.1  Hematocrit (CBC) 30.1  Platelet Count (CBC) 193  MCV 85  MCH 28.3  MCHC 33.5  RDW 15.3  Neutrophil % 72.7  Lymphocyte % 14.1  Monocyte % 11.9  Eosinophil % 0.8  Basophil % 0.5  Neutrophil # 4.0  Lymphocyte # 0.8  Monocyte # 0.6  Eosinophil # 0.0  Basophil # 0.0  Result(s) reported on 06 May 2013 at 06:20AM.   RADIOLOGY:  Radiology Results: XRay:    03-Sep-14 09:00, Chest PA and Lateral  Chest PA and Lateral  REASON FOR EXAM:    chest pain  COMMENTS:       PROCEDURE: DXR - DXR CHEST PA (OR AP) AND  LATERAL  - May 05 2013  9:00AM     RESULT: Comparison: None    Findings:     PA and lateral chest radiographs are provided. The lungs are   hyperinflated likelysecondary to COPD. There is no focal parenchymal   opacity, pleural effusion, or pneumothorax. The heart and mediastinum are   unremarkable.  The osseous structures are unremarkable.    IMPRESSION:   No acute disease of the chest.    Dictation Site: 1        Verified By: Jennette Banker, M.D., MD  [place]:    03-Sep-14 11:59, US Kidney Bilateral  US Kidney Bilateral  REASON FOR EXAM:    recurrent uti  COMMENTS:       PROCEDURE: [place]  - US  KIDNEY  - May 05 2013 11:59AM     RESULT: Comparison: None    Technique and findings: Multiple gray-scale and color doppler images of   the kidneys were obtained.     The right kidney measures 13.2 x 4.9 x 4.4 cm and the left kidney   measures 12 x 4.5 x 6.3 cm. The kidneys are normal in echogenicity. There   is no hydronephrosis.  There bilateral anechoic renal masses with the   largest on the right measuring 8 x 9 x 7 mm andthe largest in the left   kidney measuring 7.3 x 6.2 x 6.4 cm, most consistent with cysts. There     multiple bilateral nephrolithiasis versus arterial calcifications. There   is no free fluid in the region of the renal fossa.    IMPRESSION:      1. No obstructive uropathy.    2. Bilateral renal cysts.    3. Nephrolithiasis versus arterial calcifications bilaterally.    Dictation Site: 1        Verified By: Jennette Banker, M.D., MD  LabUnknown:    03-Sep-14 09:00, Chest PA and Lateral  PACS Image    03-Sep-14 09:01, CT Cervical Spine Without Contrast  PACS Image    03-Sep-14 09:01, CT Head Without Contrast  PACS Image    03-Sep-14 11:59, US Kidney Bilateral  PACS Image    03-Sep-14 23:09, CT Abdomen and Pelvis With Contrast  PACS Image  CT:    03-Sep-14 09:01, CT Cervical Spine Without Contrast  CT Cervical Spine Without Contrast  REASON FOR  EXAM:    neck pain syncope  COMMENTS:       PROCEDURE: CT  - CT CERVICAL SPINE WO  - May 05 2013  9:01AM     RESULT: Multislice helical acquisition through the cervical spine is   reconstructed at bone window settings in the axial, sagittal and coronal   planes at 2 mm slice thickness increments. There is degenerative disc   narrowing at C5-C6 and C6-C7 with minimal anterolisthesis of C5 on C6.   Degenerative hypertrophic spurring is seen at this level as well as at   C6-C7. Facet arthropathy is seen at multiple levels bilaterally. The   prevertebral soft tissues are normal. The craniocervical junction and   atlantoaxial alignment appear to be maintained. The odontoid is intact.    IMPRESSION:  Degenerative changes as described. Noacute cervical spine     bony abnormality evident.    Dictation Site: 1        Verified By: Sundra Aland, M.D., MD    03-Sep-14 09:01, CT Head Without Contrast  CT Head Without Contrast  REASON FOR EXAM:    syncope ha  COMMENTS:       PROCEDURE: CT  - CT HEAD WITHOUT CONTRAST  - May 05 2013  9:01AM     RESULT: Noncontrast CT of the brain is performed in the standard fashion.   The patient has no previous exam for comparison.    Thereis low-attenuation in the periventricular and subcortical white   matter regions. There is mild prominence of the ventricles and sulci   within normal limits for the patient's age. The study demonstrates no   evidence of hemorrhage, mass, mass effector midline shift. No evolving   infarct is appreciated at this time. The paranasal sinuses and mastoid   air cells included on the study show grossly normal aeration. The   calvarium is intact without a  depressed skull fracture.  IMPRESSION:   1. Atrophy with chronic microvascular ischemic disease. No acute   intracranial abnormality demonstrated. Some low-attenuation areas in the   basal ganglia could represent tiny lacunar infarcts or prominent   perivascular  spaces.    Dictation Site: 1        Verified By: Sundra Aland, M.D., MD    03-Sep-14 23:09, CT Abdomen and Pelvis With Contrast  CT Abdomen and Pelvis With Contrast  REASON FOR EXAM:    (1) pain; (2) prostate  COMMENTS:       PROCEDURE: CT  - CT ABDOMEN / PELVIS  W  - May 05 2013 11:09PM     RESULT: History: Pain.    Study: No recent.    Findings: Standard CT performed 300 cc of Isovue-300. Liver normal.   Portalveins patent. Spleen is normal. Splenic vein is patent.   Gallbladder is nondistended. No biliary distention. Pancreas is normal.    Adrenals normal. Bilateral renal cysts are present. Largest measures   approximately 6.5 cm in maximum diameter is inthe left kidney. No     obstructing ureteral stone or other significant hydronephrosis. Foley   catheter is in the bladder. Bladder is nondistended. Bladder wall   thickening and enhancement is present. Soft tissue edema adjacent to the   bladder present. These findings are consistent with cystitis. Air is   noted in the bladder most likely from instrumentation or infection. There   is air also noted in diverticula. Stones are noted the bladder.  Prostate   is not enlarged. No free pelvic fluid or abscess.     A 2.6 cm abdominal aortic aneurysm is present with small dissection focal   dissection. This is infrarenal suprailiac.    Diverticulosis. No diverticulitis. No bowel obstruction or free air.     Severe degenerative changes lumbar spine.Mild L4 compression pre this   may be old.  IMPRESSION:   1. Findings consistent with cystitis or bladder calculi.  2. Small aorticaneurysm with focal dissection.        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 Would see in office in 2-3 months with duplex in follow up and then likely follow this annually unless significant grownth develops. No other in hospital recs    level 4   Electronic Signatures: Algernon Huxley (MD)  (Signed 04-Sep-14 17:13)  Authored: Chief Complaint and History, PAST MEDICAL/SURGICAL HISTORY, ALLERGIES, HOME MEDICATIONS, Family and Social History, Review of Systems, Physical Exam, LABS, RADIOLOGY, Assessment and Plan   Last Updated: 04-Sep-14 17:13 by Algernon Huxley (MD)

## 2014-12-23 NOTE — H&P (Signed)
PATIENT NAME:  Carlos Gordon, Carlos Gordon MR#:  [id number] DATE OF BIRTH:  09-04-1923  REFERRING PHYSICIAN: Dr. Fanny Bien.   PRIMARY CARE PHYSICIAN: Dr. Harrington Challenger.   CHIEF COMPLAINT: Weakness, low blood pressure.   HISTORY OF PRESENT ILLNESS: Carlos Gordon is a very nice 81 year old gentleman who has history of diabetes, previous bowel obstruction, hyperlipidemia, previous TURP with urinary retention. Recently he had a catheter put on that lasted a week and it was removed about 1-1/2 weeks ago. He presented to the [facility] urgent care with a chief complaint of increasing fatigue for two weeks, and that was making him to be unsteady on his feet.   The patient apparently has been having severe nycturia and is starting to get dehydrated from all the urination and not able to replace his quickly.   He apparently was with some friends this morning and he checked his blood pressure and it was recorded as 66/40. The patient has no fever. No vomiting. No nausea. He is eating okay, but the only thing is that he continues to get weaker and weaker.   The patient went to see his urologist last week and he was told that he was going to need a cystoscopy.   The patient is admitted for gradual onset of weakness. He was found to be severely hypotensive and in DKA.   REVIEW OF SYSTEMS: A 12-system review of systems is done. Overall it is negative other than weakness.  CONSTITUTIONAL: No fever. No weight loss or weight gain. Positive fatigue. Positive weakness.  EYES: No blurry vision, double vision.  ENT: No difficulty swallowing. No epistaxis.  RESPIRATORY: No cough, wheezing, or COPD.  CARDIOVASCULAR: No chest pain, orthopnea or palpitations.  GASTROINTESTINAL: No nausea, vomiting, abdominal pain, constipation, or diarrhea.  GENITOURINARY: Positive nycturia. Positive increased frequency. No dysuria or hematuria. No prostatitis.  ENDOCRINE: Positive polyuria. Positive polydipsia. Positive nycturia. No thyroid problems.  HEMOLYMPHATIC: No anemia, easy bruising or swollen glands.  SKIN: No rashes or petechiae.  MUSCULOSKELETAL: No significant gout, back pain or neck pain.  NEUROLOGIC: No numbness, CVA, TIA, or ataxia. The patient is unsteady on his gait, though.  PSYCHIATRIC: No significant insomnia or nervousness.   PAST MEDICAL HISTORY:  1.  Iron deficiency anemia.  2.  Hiatal hernia.  3.  Diverticulosis.  4.  History of bowel obstruction and perforation, 2002.  5.  Complication of empyema with chest tube placement.  6.  Type 2 non-insulin-dependent diabetes.  7.  Hyperlipidemia.  8.  History of benign prostatic hypertrophy.  PAST SURGICAL HISTORY:  Transurethral prostate resection 25 years ago.  Chest tube due to empyema.  Laparotomy in 2005 due to obstruction and perforation of bowel for which he had another  laparotomy in    ALLERGIES:pork  AND PENICILLIN.  FAMILY HISTORY: Sister with GI cancer. His mother and father died from old age and motor vehicle accident, respectively.   SOCIAL HISTORY: The patient is married and lives with his wife. He used to smoke but has quit over 20 years ago. He has an adopted daughter. He denies any alcohol. He used to work for  [employer] for 34 years then retired.   MEDICATIONS: B12 once a day, simvastatin 40 mg daily, Protonix 20 mg daily, metoprolol 12.5 mg once daily, lisinopril 2.5 mg once daily, iron sulfate 325 mg three times daily, glipizide 10 mg once daily, aspirin 81 mg once daily, Actos 45 mg once daily.   PHYSICAL EXAMINATION:  VITAL SIGNS: Blood pressure as mentioned above,  60s over 40s. Right now 108/54 after several boluses of fluid. Pulse is 96. Respirations around 18, oxygen saturation 100% on room air, temperature 96.2.  GENERAL: Alert and oriented x3, in no acute distress. No respiratory distress. Hemodynamically stable.  HEENT: Pupils are equal and reactive. Extraocular movements are intact. Mucosae are dry. Anicteric sclerae. Pink  conjunctivae. No oral lesions. No oropharyngeal exudates.  NECK: Supple. No JVD. No thyromegaly. No adenopathy. No rigidity.  CARDIOVASCULAR: Slightly tachycardic. No murmurs, rubs or gallops. Regular rate and rhythm. No displacement of  PMI.  LUNGS: Clear without any wheezing or crepitus. No use of accessory muscles.  ABDOMEN: Soft, nontender, nondistended. No hepatosplenomegaly. No masses. Bowel sounds are positive.  EXTREMITIES: No edema, cyanosis or clubbing.  GENITAL: Negative for external lesions.  SKIN: No rashes, petechiae or new wounds.  MUSCULOSKELETAL: No significant joint abnormalities, joint effusions or erythema.  LYMPHATIC: Negative for lymphadenopathy in the neck or supraclavicular areas.  VASCULAR: Pulse is strong, +2 at this moment, and capillary refill is about 5 seconds.  NEUROLOGIC: Cranial nerves II through XII intact. His strength is equal in all four extremities, five out of five.  PSYCHIATRIC: Very pleasant, alert, oriented x3, good judgment.  LABORATORY, DIAGNOSTIC AND RADIOLOGICAL DATA: Glucose is 637 on admission. Last Accu-Chek 600. Creatinine 1.54, BUN 39, sodium 132, potassium 5.5. Serum CO2 is 18. GFR is around 40. Calcium is 10. Total CK is 27. Troponin is 0.02. White count is 6.4, hemoglobin is 13, platelets 266.   Urinalysis showed more than 1000 glucose. Ketones were large. Protein more than 30, red blood cells more than 30, white blood cells too numerous to count. Bacteria positive. Positive leukocyte esterase, negative nitrites, and pH is 7.21 and pCO2 of 38.   ASSESSMENT AND PLAN:  1.  Hypotension. The patient is likely hypotensive due to #1, intravascular volume depletion secondary to diabetic ketoacidosis and also secondary to septic process. The patient is hypothermic with a temperature of 96, tachycardic. Likely this is systemic inflammatory response syndrome due to urinary tract infection. The patient receiving boluses of IV fluids. He is going to be  monitored closely on the critical care unit.  2.  Systemic inflammatory response syndrome with evidence of tachycardia and low temperature. The patient admitted to critical care unit. He is on Rocephin and blood cultures taken. Urine cultures sent. Blood cultures were not sent.  3.  Diabetic ketoacidosis. The patient has diabetic ketoacidosis likely secondary to the infection. He is a non-insulin-dependent diabetic. He has been well controlled for a while. Now he is having blood sugars in the 600s. For now, we are going to stop the Actos and stop the glipizide, keep him n.p.o., give him a large volume of fluids. Will start him on an insulin drip, 1 unit per mL at 0.1 units/kg per hour. The patient still has not decreased his blood sugars despite the fact that he has been on the drip for several hours, for what we are going to give him a bolus of 5 units right now, IV. The patient is going to be monitored in the critical care unit. Frequent Accu-Cheks. When blood sugars get into the 200s, we are going to change to D5 normal saline and if the anion gap is close, we are going to stop the insulin drip and change it to subcutaneous insulin. Hemoglobin A1c check. TSH check. Likely the CK is exacerbated or caused the urinary tract infection.  4.  Urinary tract infection. The patient had unwilling Foley  catheter placed for over a week and removed about a week and a half ago. This could be the source with his blood sugars so high and the infection is going to get worse. We are going to treat it with IV Rocephin. Urine cultures taken.  5.  Benign prostatic hypertrophy. Continue follow-up with urology when patient more stable for a  transurethral resection of prostate.  6.  Iron deficiency anemia. At this moment, his hemoglobin is stable. It is around 13, but the patient also could be hemoconcentrated, for what we are going to monitor closely after IV fluids.  7.  Hyperkalemia. At this moment, there is no need to correct  the potassium since we are giving  large IV fluids and insulin.  8.  Sodium hyponatremia, due to intravascular volume depletion, and on top of that, pseudohyponatremia due to hyperglycemia.  9.  Acute kidney injury with increase of creatinine from 1.1 to 1.4. Intravenous fluids given.  10.  Avoid nephrotoxins. Hold blood pressure medications. Hold ACE inhibitors.  11.  Gastrointestinal prophylaxis with Protonix.  12.  Deep vein thrombosis prophylaxis with heparin.   CODE STATUS: DNR.   TIME SPENT: I spent about 50 minutes with this patient, critical care time, going to the CCU.     ____________________________ Felipa Furnace, MD rsg:np D: 04/30/2013 19:56:45 ET T: 04/30/2013 20:26:29 ET JOB#: [id number]  cc: Felipa Furnace, MD, <Dictator> Quest Tavenner Juanda Chance MD ELECTRONICALLY SIGNED 05/04/2013 11:53

## 2014-12-23 NOTE — Consult Note (Signed)
PATIENT NAME:  Carlos Gordon, Jonatan L MR#:  [id number] DATE OF BIRTH:  1924/04/18  DATE OF CONSULTATION:  05/05/2013  REFERRING PHYSICIAN:  Auburn BilberryShreyang Patel, MD CONSULTING PHYSICIAN:  A. Wendall MolaMelissa Konnor Jorden, MD  CHIEF COMPLAINT: Hypoglycemia.   HISTORY OF PRESENT ILLNESS: This is an 81 year old male with type 2 diabetes, well known to me, who was admitted today from the Emergency Department with severe hypoglycemia and altered mental status. The patient has had a long history of type 2 diabetes. He was last seen in [facility] in March 2014, at which time A1c was in good range at 6.7% while taking pioglitazone 45 mg daily, glipizide 5 mg b.i.d. and metformin 500 mg b.i.d.  He has developed recurrent urinary tract infections and has been following with Dr. Sampson GoonFitzgerald, infectious disease. He was [facility] for diabetic ketoacidosis in the setting of hypotension and urinary tract infection  He was in the hospital from 08/29 to 05/02/2013. His initial blood sugar was over 600, he had large ketones in the urine and a bicarb of 18, as well as acute renal failure with creatinine of 1.5. He was initiated on IV fluids, IV insulin and then after a 48 hour hospitalization, he was discharged on a regimen of Lantus 29 units at bedtime and NovoLog 5 units t.i.d. a.c. All oral medicines had been stopped. Since that hospitalization, his grandson, [name], and his wife, [name], have been helping care for Mr. Carlos Gordon. They have been visiting him daily and grandson, [name], has been monitoring blood sugars and administering insulin. They report he has had recurrent hypoglycemia with sugars in the 40s on a frequent basis. They had a follow up with Dr. Audelia Actonheis yesterday and were advised to decrease the Lantus to 20 units at bedtime. Unfortunately, this morning, he was found unconscious at home by his wife. EMS was called. His initial blood sugar was found to be 25. He was treated with IV dextrose and  brought to the ED. After repeated doses of IV dextrose, his serum blood sugar was in the 150s. He is now alert and responsive and has no acute complaints. He reports his appetite is fairly good. He has had about a 15 pound weight loss in the last few months.   PAST MEDICAL HISTORY: 1.  Type 2 diabetes.  2.  Recurrent UTI.  3.  Anemia.  4.  Hyperlipidemia.  5.  Chronic obstructive pulmonary disease.  6.  History of aortic valve stenosis.  7.  CAD status post acute MI.  8.  History of GI bleed.  9.  Hypertension.  10.  GERD.  FAMILY HISTORY: No known diabetes. Positive for prostate cancer.   SOCIAL HISTORY: No alcohol or tobacco use. The patient lives with his wife.   ALLERGIES: PENICILLIN.   MEDICATIONS: On admission: 1.  Lantus 20 units at bedtime.  2.  NovoLog t.i.d. q.a.c.  3.  Aspirin 81 mg daily.  4.  Iron 325 mg t.i.d.  5.  Keflex 500 mg t.i.d.  6.  Protonix 20 mg daily.  7.  Simvastatin 40 mg daily.  8.  B12 1 tab daily.   REVIEW OF SYSTEMS:  GENERAL: 15 pound weight loss. No fever.  HEENT: Denies blurred vision. Denies sore throat.  NECK: Denies neck pain. Denies dysphagia.  CARDIAC: Denies chest pain. Denies palpitation. PULMONARY: Denies cough. Denies shortness of breath.  ABDOMEN: Denies nausea. Denies abdominal pain.  EXTREMITIES: Denies leg swelling.  SKIN: Denies easy bruisability or recent bleeding.  ENDOCRINE:  Denies heat or cold intolerance.  SKIN: Denies rash or dermatopathy.   PHYSICAL EXAMINATION: VITAL SIGNS: Height 71.9 inches, weight 137 pounds, BMI 18.6. Pulse 72, respirations 17, blood pressure 110/46, pulse ox 99% on room air.  GENERAL: Thin, elderly male in no acute distress.  HEENT: EOMI. Oropharynx is clear.  NECK: Supple. No thyromegaly.  CARDIAC: Regular rate and rhythm. No carotid bruit.  LUNGS: Clear to auscultation bilaterally. No wheeze.  ABDOMEN: Diffusely soft, nontender, nondistended.  EXTREMITIES: No swelling is present.  SKIN:  No rash or dermatopathy is present.  PSYCHIATRIC: Alert and oriented x 3.   LABORATORY DATA: Glucose 158, BUN 19, creatinine 0.67, sodium 132, potassium 3.8, calcium 8.9, AST 49, ALT 25, albumin 2.3, CK 614, CK-MB 7.4, troponin I 0.08. TSH 3.17. Hematocrit 37.4, WBC 8.3, platelets 235. Urinalysis notable for 150 mg/dL glucose, 3+ blood, negative ketones, 30 mg/dL protein, negative nitrite, 3+ leuk esterase and positive leukocytes.    RADIOLOGY:  1. CT HEAD: Atrophy with chronic microvascular ischemic change. No acute intracranial abnormality.  2.  CT C-SPINE: Degenerative changes. No acute C-spine abnormality.  3. BILATERAL KIDNEY ULTRASOUND:  1)  No obstructive uropathy. 2) Bilateral renal cysts. 3) Nephrolithiasis versus arterial calcifications bilaterally.   ASSESSMENT: An 81 year old male with type 2 diabetes, uncontrolled with episode of severe hypoglycemia. A second issue is recurrent urinary tract infections.   RECOMMENDATIONS: 1.  Recommend reinitiation of oral antidiabetic medications. He had been doing fairly well on those in March of this year and only had severe problems with blood sugars once he developed the urinary tract infections. I recommend initiating metformin first and will start 500 mg b.i.d.  tomorrow. We will keep in mind that if any contrast studies are needed, this medicine will need to be stopped and held for 2 days.  2.  Would prefer to avoid insulin or other potential hypoglycemia causing medications, such as sulfonylureas for now.  3.  Do anticipate restarting his pioglitazone as well; however, this does not typically give great benefit immediately after it is started, so would not be my first choice in treatment of diabetes.   Thank you for the kind request for consultation. I will follow along with you.   ____________________________ A. Wendall Mola, MD ams:dmm D: 05/05/2013 17:37:30 ET T: 05/05/2013 19:25:06 ET JOB#: [id number]  cc: A. Wendall Mola, MD,  <Dictator> Macy Mis MD ELECTRONICALLY SIGNED 05/13/2013 13:09

## 2014-12-23 NOTE — Op Note (Signed)
PATIENT NAME:  Carlos PoissonMURDOCK, Ayinde L MR#:  [id number] DATE OF BIRTH:  Dec 04, 1923  DATE OF PROCEDURE:  06/04/2013  PREOPERATIVE DIAGNOSIS:  Vesicolithiasis with severe cystitis.   POSTOPERATIVE DIAGNOSIS:  Vesicolithiasis with severe cystitis.   PROCEDURE:  Cystoviscolithotripsy of a bladder stone.   ANESTHESIA:  General.   COMPLICATIONS: None.  PROCEDURE:  With the patient sterilely prepped and draped in supine lithotomy position, I approached the bladder through the urethra with a 21-French sheath and a 30-degrees lens. I see the calculus in the bladder. It is somewhat difficult to see in the bladder because of the amount of irritation and blood from this calculus. The lateral and posterior walls of the bladder appear to have a chronic cystitis or carcinoma in situ. I did not dare to biopsy because he had an infection now for at least 2 or 3 months. The infection, I am assuming, is his calculus. So the calculus is disintegrated utilizing a 550-micron holmium laser. This in-firing laser is placed on the calculus, breaks it up into multiple small fragments. The fragments then most are evacuated out with suction but there are some small fragments left, which did not come and he will have to void them out. He is sent to Recovery in satisfactory condition with a 20-French Foley, 10 mL in the balloon. Then 30 mL of Marcaine are placed in the bladder. He is given a V and O suppository. It should be noted that his prostate was not enlarged. There is no obstructive prostate present. The patient tolerated the procedure well. Rectal exam revealed no nodules, masses or growths in the rectum or the prostate.   ____________________________ Caralyn Guileichard D. Edwyna ShellHart, DO rdh:jm D: 06/04/2013 10:34:25 ET T: 06/04/2013 10:43:51 ET JOB#: [id number]  cc: Caralyn Guileichard D. Edwyna ShellHart, DO, <Dictator> Tianni Escamilla D Marycatherine Maniscalco DO ELECTRONICALLY SIGNED 06/04/2013 13:42

## 2014-12-23 NOTE — Discharge Summary (Signed)
PATIENT NAME:  Carlos Gordon, Carlos Gordon MR#:  [id number] DATE OF BIRTH:  [birth year]  DATE OF ADMISSION:  05/05/2013 DATE OF DISCHARGE:  05/07/2013   DISPOSITION: Nursing home discharge.    ADMITTING DIAGNOSES: Decrease in responsiveness, hypoglycemia, hypothermia, hypotension.   DISCHARGE DIAGNOSES:  1. Possible sepsis due to urinary tract infection due to Candida krusei.  2. Hypoglycemia due to recent initiation of insulin. The patient restarted on metformin. Will need outpatient followup with endocrinology.  3. Benign prostatic hypertrophy with outlet symptoms. Seen by Dr. Edwyna Shell of urology. Started on Flomax and finasteride. Will need outpatient followup for possible cystoscopy due to acute renal calculi.  4. Focal aortic dissection. Outpatient followup with Dr. Wyn Quaker.  5. Moderate to severe aortic stenosis. Family requests [facility] Card[facility] evaluation.  6. Diminished systolic function, possibly related to aortic stenosis. Again, needs further evaluation. I am not sure if he is a candidate for any intervention based on his age.  7. Mildly elevated creatine phosphokinase, possibly due to rhabdomyolysis.   8. Elevated cardiac enzymes, possibly due to demand ischemia.  9. Hypothermia, possibly due to sepsis, now temperature normal.  10. Do not resuscitate status.  11. History of iron deficiency anemia.  12. History of hiatal hernia.  13. History of diverticulosis.  14. Status post transurethral resection of prostate 25 years ago.  15. History of chest tube placement for empyema.  16. Laparotomy in 2005 due to obstruction and perforation of the bowel.   PERTINENT LABORATORIES AND EVALUATIONS: Admitting glucose 158, BUN 19, creatinine 0.67, sodium 132, potassium 3.8, chloride 99, CO2 is 25, calcium 8.9. LFTs: Total protein 5.8, albumin 2.3, bilirubin total 0.4, alkaline phosphatase is 85, CPK was 614, CK-MB 7.4, troponin 0.08. TSH 3.17. WBC 8.3, hemoglobin 12.4. Blood cultures x2: No growth. Urine cultures:  No growth. Urinalysis: 3+ leukocytes, WBCs 419. The patient's recent urine culture from recent hospitalization showed Candida krusei resistant to fluconazole. Cortisol 18.7. EKG shows normal sinus rhythm with low-voltage criteria. Echocardiogram of the heart shows LVEF 30% to 35%, mild mitral valve regurgitation, moderate to severe aortic valve stenosis, mildly increased left ventricular posterior wall thickness, mild tricuspid regurgitation. Bilateral kidney ultrasound showed no obstruction, bilateral renal cysts. nephrolithiasis versus arterial calcifications bilaterally.   CONSULTANTS: Dr. Sampson Goon, Dr. Edwyna Shell of urology, Dr. Tedd Sias, Dr. Wyn Quaker.   HOSPITAL COURSE: Please refer to H and P done by me on admission. The patient is an 81 year old white male who was recently hospitalized from August 29 to August 31. At that time, he presented with weakness and low blood pressure and was noted to have possible diabetic ketoacidosis The patient was placed on insulin. It is unlikely the patient had DKA based on his advanced age. He was discharged home after 2 days of being hospitalized on insulin, which was new for him. The patient was only on previous oral regimen. His hemoglobin A1c was 11.6 though. The patient was discharged home. He did okay for a day and then started feeling weak and tired. On the day of admission, he was found on the floor with blood sugars being very low. He was brought to the ED with altered mental status. The patient was noted to have hypothermia on presentation, hypoglycemia and hypotension. Due to this, he was admitted to the hospital for possible sepsis syndrome. Initially, he was placed in the ICU and was started on warming blankets. He was given IV fluids and placed on IV antibiotics. His recent urine cultures from the hospitalization from August 29 to August  31 showed a fungal infection. I asked ID to see the patient. They recommended treating him with IV antifungal medications. The patient  was kept on these for 3 days with micafungin. The patient has been afebrile, and he does not have any leukocytosis. Due to his hypotension and falls, an echocardiogram of the heart was done which showed that he has severe to moderate aortic stenosis. He will need outpatient cardiology evaluation. The patient also was noted to have recurrent urinary symptoms. Therefore, urology consult was obtained. They recommended outpatient cystoscopy. He was recommended starting on finasteride and Flomax, which he will be initiated on. The patient also was noted to have a focal aortic abdominal dissection for which he was seen by Dr. Wyn Quakerew, who recommended outpatient followup. He also was noted to have moderate to severe aortic stenosis for which he will be seen as an outpatient by cardiology. At this time, the patient is stable in terms of his hypoglycemia. He was seen by Dr. Tedd SiasSolum of endocrinology, and she recommended starting him on metformin, but because he received IV contrast, his metformin was not started. His sugars have been anywhere from the 150s to 200. He will be restarted on metformin.   CODE STATUS: DNR.   DISCHARGE MEDICATIONS:  1. Aspirin 81 mg 1 tab p.o. daily.  2. Iron sulfate 325 mg 1 tab p.o. t.i.d. 3. Protonix 20 daily.  4. Vitamin B12 1 tab p.o. daily. 5. Tylenol 650 q.4 p.r.n.  6. Glucophage 1000 mg 1 tab p.o. b.i.d. 7. Flomax 0.4 daily.  8. Finasteride 5 mg daily. 9. VFEND 50 mg 2 tabs every 12 hours for the next 4 days.   DIET: Low sodium, low fat, low cholesterol.   ACTIVITY: As tolerated with PT, OT evaluation and treatment.   DISCHARGE INSTRUCTIONS: Follow with primary MD in 1 to 2 weeks. [facility], Dr. Edwyna ShellHart, in 2 to 4 weeks. Dr. Wyn Quakerew in 2 to 3 months. [facility] for [facility] stenosis in 4 to 6 weeks. Dr. Tedd SiasSolum in 2 to 3 weeks. The patient to have Accu-Cheks before meals and at bedtime. He is to receive PT/OT at the skilled nursing facility.    TIME SPENT:  Note, 35 minutes spent on the discharge.  ____________________________ Lacie ScottsShreyang H. Allena KatzPatel, MD shp:OSi D: 05/07/2013 12:04:00 ET T: 05/07/2013 12:28:38 ET JOB#: [id number]  cc: Cecilee Rosner H. Allena KatzPatel, MD, <Dictator> Charise CarwinSHREYANG H Rozanna Cormany MD ELECTRONICALLY SIGNED 05/15/2013 14:15

## 2014-12-23 NOTE — Consult Note (Signed)
Brief Consult Note: Diagnosis: Recurrent UTIs, fungeuria, Urinary retention requiring repeat foley cath, Poorly controlled DM.   Patient was seen by consultant.   Consult note dictated.   Recommend further assessment or treatment.   Orders entered.   Comments: Cont micafungin Cont ceftriaxone Consult urology for help managing his urinary retention  and recurrent utis - liekly needs cystoscopy Consult Dr Tedd SiasSolum for DM management Thank you for consult. WIll follow with you.  Electronic Signatures: Dierdre HarnessFitzgerald, Paola Patrick (MD)  (Signed 03-Sep-14 16:47)  Authored: Brief Consult Note   Last Updated: 03-Sep-14 16:47 by Dierdre HarnessFitzgerald, Jaskaran Patrick (MD)

## 2014-12-23 NOTE — H&P (Signed)
PATIENT NAME:  Carlos Gordon, Carlos Gordon MR#:  [id number] DATE OF BIRTH:  28-Mar-1924  DATE OF ADMISSION:  05/05/2013  PRIMARY CARE PROVIDER: Dr. Harrington Challenger.  EMERGENCY ROOM REFERRING PHYSICIAN: Dr. Mindi Junker.   CHIEF COMPLAINT: Decrease in  responsiveness, hypoglycemia, hypothermic and hypotension.   HISTORY OF PRESENT ILLNESS: The patient is an 81 year old Caucasian male who was recently hospitalized from 04/30/2013 to 05/02/2013 after he had presented with weakness and low blood pressure. The patient was thought to have DKA, diabetic ketoacidosis and systemic inflammatory response with tachycardia and low temperature. The patient was thought to have a urinary tract infection. He was treated with IV insulin and aggressive IV fluids. He was also on IV antibiotics. The patient started improving by day 2, and then he is strongly insisted on going home so he was discharged. The patient was discharged on insulin, Lantus 29 units as well as sliding scale insulin, as well as Keflex. The patient did okay for the first day according to his grandson who is at the bedside. He was still very weak and his blood sugar started dropping, so they called in his primary care provider. The primary care physician told him to give the patient 20 units yesterday. Earlier today, the patient was noticed by his wife to be poorly responsive, found on the floor with some bleeding around his mouth.  EMS was called. When they arrived, he was noted to have hypoglycemia. The patient was also noticed to be hypotensive. Please note initial blood pressure was normal but then he started becoming hypotensive and a rectal temperature revealed that his temperature was 93. So he was started on aggressive IV fluids. He also has a warming blanket in place. The patient currently opens his eyes, is more responsive than before, but does not answer any questions. His grand son is at the bedside.   PAST MEDICAL HISTORY: 1.  History of diabetes type 2.  2.  History  of iron deficiency anemia.  3.  Hiatal hernia.   4.  History of diverticulosis.  5.  History of bowel obstruction with perforation in 2012.  6.  Hyperlipidemia.  7.  History of BPH.   PAST SURGICAL HISTORY:  1.  Status post TURP 25 years ago.  2.  History of a chest tube due to empyema.  3.  Laparotomy in 2005 due to obstruction and perforation of the bowel.   ALLERGIES:  PENICILLIN AND ATIVAN.   FAMILY HISTORY: Sister with GI cancer. His mother and father died from old age. Mother of a vehicle accident.   SOCIAL HISTORY: The patient is married and lives with his wife. He used to smoke, but quit 20 years ago. He denies any alcohol. He use to work for [employer] for 34 years.   CURRENT MEDICATIONS: At home:  Lantus 20 units at bedtime, [place] 1 tab p.o. daily, aspirin 81, one tab p.o. daily, iron sulfate 325 one tab p.o. t.i.d., sliding scale of insulin, Lantus 20 units, Keflex 500 one tab p.o. t.i.d., Protonix 20 daily, simvastatin 40 daily, vitamin B12 one tab p.o. daily.   REVIEW OF SYSTEMS: Unobtainable due to the patient being very lethargic. Altered mental status.    PHYSICAL EXAMINATION:   VITAL SIGNS: At the time of my evaluation, his temperature was 93.5, pulse was 85, respirations 19, blood pressure 90/60, O2 100% on room air.  GENERAL: The patient is a critically ill-appearing male who opens his eyes but does not interact completely.  HEENT: Head atraumatic, normocephalic. Pupils equally  round, and reactive to light and accommodation. There is no conjunctival pallor.  Unable to do extraocular movements. NECK: Supple. No JVD. No thyromegaly. No adenopathy.  CARDIOVASCULAR: Regular rate and rhythm. No murmurs, gallops, clicks or heaves.  LUNGS: Clear to auscultation without any rales, rhonchi, wheezing.  ABDOMEN: Soft, nontender, nondistended. No hepatosplenomegaly.  EXTREMITIES: No clubbing, cyanosis, or edema.  GENITOURINARY: Deferred.  SKIN: No rashes, petechiae, or  any other lesions.  MUSCULOSKELETAL: There is no erythema or swelling.  LYMPHATICS: No lymph nodes palpable.  VASCULAR: Good DP, PT pulses.  NEUROLOGICAL: The patient is lethargic, but was moving all extremities prior to this episode.  PSYCHIATRIC: The patient is not awake, drowsy.   LABORATORY, DIAGNOSTIC AND RADIOLOGICAL DATA: Glucose 158, BUN 19, creatinine 0.67, sodium 132, potassium 3.8, chloride 99, CO2 is 25, calcium 8.9. LFTs: Total protein 5.8, albumin 2.3, bilirubin total 0.4, alkaline phosphatase is 85, AST 49, ALT is 25, CPK 614, CK-MB is 7.4, troponin 0.08. WBC 8.3, hemoglobin 12.4, platelet count 235. Urinalysis shows 3+ leukocytes, RBCs 368. WBC 419.  CT scan of the head shows age-related atrophy  with chronic microvascular changes. Some low attenuation area in the basal ganglia could represent tiny lacunar infarct. CT of the cervical spine shows degenerative changes. No acute cervical abnormality.   ASSESSMENT AND PLAN: The patient is an 81 year old white male with recent hospitalization from 04/30/2013 to 05/03/2013 who was admitted with diabetic ketoacidosis hyperglycemia and hypotension found down by family.  1.  Acute encephalopathy likely due to metabolic nature with hypoglycemia, hypotension and hypothermia. At this time, we will give him IV fluids, follow blood sugar. Warming blanket. Monitor his mental status.  2.  Hypoglycemia recently started on insulin. We will hold insulin. He will need much lower dose of insulin. His diabetes is under very poor control.  3.  Hypothermia, possibly due to sepsis syndrome. At this time, we will give him IV ceftriaxone. Urine culture from recently shows Candida. We have placed him on IV fluconazole. Urinary tract infection with fungal cultures are not usually treated but in light of the patient's significant presentation, I will treat him with IV fluconazole.  He was supposed to see Dr. Sampson GoonFitzgerald whom I will ask to see.    4.  Elevated cardiac  enzyme with elevated CPK. The patient's cholesterol medication has been held. This could be just rhabdomyolysis or could be demand ischemia. We will follow his cardiac enzymes. I will also get an echocardiogram of the heart.  5.  Benign prostatic hypertrophy. Hold his home regimen for the time being.  6.  CODE STATUS: DO NOT RESUSCITATE.  Please note critical care time spent on this patient: 55 minutes.   He is at high risk of cardiopulmonary collapse and arrest.    ____________________________ Lacie ScottsShreyang H. Allena KatzPatel, MD shp:dp D: 05/05/2013 10:41:06 ET T: 05/05/2013 11:11:20 ET JOB#: [id number]  cc: Courtenay Hirth H. Allena KatzPatel, MD, <Dictator> Charise CarwinSHREYANG H Kiriana Worthington MD ELECTRONICALLY SIGNED 05/21/2013 12:58

## 2014-12-23 NOTE — Discharge Summary (Signed)
PATIENT NAME:  Carlos Gordon, Carlos Gordon MR#:  [id number] DATE OF BIRTH:  Jun 13, 1924  DATE OF ADMISSION:  04/30/2013 DATE OF DISCHARGE:  05/02/2013  ADMITTING DIAGNOSIS: Weakness, hypotension.   DISCHARGE DIAGNOSES: 1.  Systemic antiinflammatory response reaction. 2.  Hypotension. 3.  Dehydration.  4.  Diabetic ketoacidosis with Hemoglobin A1c 13.5. 5.  History of hypertension, hyperlipidemia and generalized weakness. 6.  Urinary tract infection.  DISCHARGE CONDITION: Stable.  DISCHARGE MEDICATIONS: The patient is to continue: 1.  Allantex oral tablet 1 tablet once daily. 2.  Aspirin 81 mg p.o. daily. 3.  Iron sulfate 325 mg p.o. 3 times daily. 4.  Pantoprazole 20 mg p.o. daily.  5.  Simvastatin 40 mg p.o. daily. 6.  Vitamin B12 one tablet once daily, unknown dose.  NEW MEDICATIONS: 1.  Insulin glargine which is insulin Lantus 29 units subcutaneously at that time. 2.  Short-acting insulin aspart sliding scale. 3.  Keflex 500 mg p.o. 3 times daily for suspected urinary tract infection.   CONSULTANTS: Care management.   RADIOLOGIC STUDIES: None.   HISTORY AND HOSPITAL COURSE: The patient is an 81 year old male with past medical history significant for history of diabetes mellitus who presented to the hospital with complaints of weakness as well as low blood pressure. Please refer to Dr. Mordecai Maes Gutierrez's admission note on 04/30/2013.   On arrival to the hospital, the patient's blood pressure was 60s/40s. With IV fluid boluses it increased to 108/54 on admission, pulse 96, respiratory rate was 18, 02 sats were 100% on room air and temperature 96.2. Physical examination was unremarkable.   The patient's lab data done on admission, 04/30/2013, showed elevated glucose to 637, BUN and creatinine were 39 and 1.54, sodium 132, potassium 5.5, bicarbonate was 18 and anion gap was elevated at 19. Cardiac enzymes first set negative. CBC was within normal limits with white blood cell count 6.4,  hemoglobin 13.0, platelet count 266 and absolute neutrophil count was normal at 5.0. Urinalysis revealed yellow cloudy urine, more than (Dictation Anomaly) 1000 of glucose, negative for bilirubin, large ketones, specific gravity 1.015, pH 6.0, 3+ blood, 30 mg/dL protein, 1+ leukocytes esterase, more than 30 red blood cells, too numerous to count white blood cells, 1+ bacteria, no epithelial cells or white blood cell clumps were present, as well as budding yeast. ABGs were done on room air and showed pH of 7.21 and  pCO2 was 38.  The patient was admitted to the hospital for further evaluation. He was admitted for DKA. He was initiated on insulin IV drip and was calculated for insulin Lantus. He was rehydrated and his blood pressure as well as kidney insufficiency resolved. With this he improved significantly and felt very well. He is to continue insulin therapy, long-acting as well as short-acting insulin. He is to follow up with [facility] as outpatient. He will have also home health nurse coming in and checking on him for insulin administration so he will be compliant with insulin administration. He educated in the hospital about insulin use. He was recommended to discontinue his other diabetic oral medications since now he is on insulin. His Hemoglobin A1c was found to be high at 13.5, and it was felt that the patient's presentation was very likely related to severe dehydration related to poorly controlled diabetes mellitus, less so due to infection since the patient's urine cultures x 2 came back positive for yeast, but no bacterial organisms. However, because results were not obtained by the time of discharge, we discharged the patient  on Keflex orally.  In regards to hypotension, as mentioned above, it was felt to be dehydration-related, less so due to infection. The patient was continued on IV fluids. In the hospital, his blood pressure improved. On the day of discharge, the patient's blood pressure  is ranging from 99 to 125 systolic. The patient was recommended to drink plenty of fluids and resume his outpatient medications; however, he is to hold his blood pressure medications until he is seen by his primary care physician in the next few days after discharge.  For history of hyperlipidemia, the patient is to continue simvastatin.  Due to generalized weakness, he was seen by a physical therapist who recommended home health physical therapy, which will be arranged for him upon discharge. The patient is being discharged in stable condition with the above-mentioned medications and follow-up.   His vital signs on the day of discharge: Temperature 97.7, pulse was 80s to 90s, respiratory rate was 18 to 20, blood pressure ranging from 99 to 125 systolic and 50s diastolic. O2 sats were 95 to 97% on room air at rest.   TIME SPENT: 40 minutes.  ____________________________ Katharina Caperima Yehuda Printup, MD rv:sb D: 05/02/2013 15:21:54 ET T: 05/03/2013 09:17:58 ET JOB#: [id number]  cc: Katharina Caperima Talynn Lebon, MD, <Dictator> Treyana Sturgell MD ELECTRONICALLY SIGNED 05/31/2013 8:36

## 2014-12-23 NOTE — Discharge Summary (Signed)
PATIENT NAME:  Carlos Gordon, Carlos Gordon MR#:  [id number] DATE OF BIRTH:  04-Mar-1924  DATE OF ADMISSION:  04/30/2013 DATE OF DISCHARGE:  05/02/2013  ADMITTING DIAGNOSIS: Weakness, hypotension.  DISCHARGE DIAGNOSES: 1.  Systemic antiinflammatory response reaction. 2.  Hypotension. 3.  Dehydration. 4.  Diabetic ketoacidosis with Hemoglobin A1c 13.5. 5.  History of hypertension, hyperlipidemia, generalized weakness. 6.  Urinary tract infection.  DISCHARGE CONDITION: Stable.  DISCHARGE MEDICATIONS: The patient is to continue: 1.  Allantex oral tablet 1 tablet once daily. 2.  Aspirin 81 mg p.o. daily. 3.  Iron sulfate 325 mg p.o. 3 times daily. 4.  Pantoprazole 20 mg p.o. daily.  5.  Simvastatin 40 mg p.o. daily. 6.  Vitamin B12 1 tablet once daily, unknown dose.  NEW MEDICATIONS: 1.  Insulin glargine which is insulin Lantus 29 units subcutaneously at bedtime. 2.  Short-acting insulin aspart sliding scale. 3.  Keflex 500 mg p.o. 3 times daily for suspected urinary tract infection.   CONSULTANTS: Care management.  RADIOLOGIC STUDIES: None.   HISTORY AND HOSPITAL COURSE: The patient is an 81 year old male with past medical history significant for history of diabetes mellitus who presents to the hospital with complaints of weakness as well as low blood pressure. Please refer to Dr. Mordecai MaesSanchez Gutierrez's admission note on 04/30/2013.   On arrival to the hospital, the patient's blood pressure was 60s/40s. With IV fluid boluses it increased to 108/54 on admission, pulse was 96, respiratory rate was 18, O2 sats were 100% on room air and temperature 96.2. Physical examination was unremarkable.   The patient's lab data done on admission, 04/30/2013, showed elevated glucose to 637, BUN and creatinine were 39 and 1.54, sodium 132, potassium 5.5, bicarbonate 18 and anion gap was elevated at 19. Cardiac enzymes first set negative. CBC was within normal limits with white blood cell count 6.4, hemoglobin 13.0,  platelet count 266. Absolute neutrophil count was normal at 5.0. Urinalysis revealed yellow cloudy urine, (Dictation Anomaly) 1000 mg/dl of glucose, negative for bilirubin, large ketones, specific gravity 1.015, pH 6.0, 3+ blood, 30 mg/dL protein, 1+ leukocytes esterase, more than 30 blood cells, too numerous to count white blood cells, 1+ bacteria, no epithelial cells or white blood cell clumps were present, as well as budding yeast. ABGs were done on room air and showed of pH of 7.21 and pCO2 was 38.   The patient was admitted to the hospital for further evaluation. He was admitted for DKA. He was initiated on insulin IV drip and was calculated for insulin Lantus. He was rehydrated and his blood pressure as well as kidney insufficiency resolved. With this he improved significantly and felt very well. He is to continue insulin therapy, long-acting as well as short-acting insulin. He is to follow up with the [facility] as an outpatient. He will have also home health nurse coming in and checking on him for insulin administration so he would be compliant with insulin administration. He was educated in the hospital about insulin use. He was recommended to discontinue his other diabetic oral medications since now he is on insulin. His Hemoglobin A1c was found to be high at 13.5. It was felt that the patient's presentation was very likely related to severe dehydration related to poorly controlled diabetes mellitus and less so due to infection since the patient's urine cultures x 2 came back positive for yeast but no bacterial organisms. However, because results were not obtained by the time of discharge, we discharged the patient on Keflex orally.  In regards to hypotension, as mentioned above, it was felt to be dehydration related and less so due to infection. The patient was continued on IV fluids in the hospital. His blood pressure improved. On the day of discharge, the patient's blood pressure is ranging  from 99 to 125 systolic. The patient was recommended to drink plenty of fluids and resume his outpatient medications. However, he is to hold his blood pressure medications until he is seen by his primary care physician in the next few days after discharge.  For history of hyperlipidemia, the patient is to continue simvastatin.   Due to generalized weakness, he was seen by a physical therapist who recommended home health physical therapy, which will be arranged for him upon discharge. The patient is being discharged in stable condition with the above-mentioned medications and follow-up.   His vital signs on the day of discharge: Temperature 97.7, pulse was 80s to 90s, respiratory rate was 18 to 20, blood pressure ranging from 99 to 125 systolic and 50s diastolic. Oxygen sats were 95 to 97% on room air at rest.   TIME SPENT: 40 minutes. ___________________________ Katharina Caper, MD rv:sb D: 05/02/2013 15:21:54 ET T: 05/03/2013 09:10:19 ET JOB#: [id number]  cc: Katharina Caper, MD, <Dictator> Phallon Haydu MD ELECTRONICALLY SIGNED 05/31/2013 8:35

## 2014-12-23 NOTE — Consult Note (Signed)
PATIENT NAME:  Carlos Gordon, Carlos Gordon MR#:  [id number] DATE OF BIRTH:  05/02/1924  DATE OF CONSULTATION:  05/05/2013  REFERRING PHYSICIAN:  Dr. Posey Pronto CONSULTING PHYSICIAN:  Cheral Marker. Ola Spurr, MD  PRIMARY CARE PHYSICIAN:  Dr. Ezequiel Kayser   REASON FOR CONSULTATION: Recurrent urinary tract infection and sepsis.   HISTORY OF PRESENT ILLNESS: This is a pleasant 81 year old gentleman who I initially met in September of last year with a history of recurrent urinary tract infections. At that time, he was dealing with an infection growing enterococcus species that was multi-drug-resistant.  He was also known to have a PENICILLIN ALLERGY. At that time, we were able to rechallenge him with penicillin and treat his infection. He did well for a number of months; however, he began to have increasing recurrence of symptoms in June. At that time, he was encouraged to follow up with his urologist; however, he was unable to get in. We treated him with amoxicillin suppressive therapy for a 30 day course. Following his symptoms in July, he ended up having great difficulty urinating and went to [facility] urgent care center where a catheter had to be replaced. Apparently 2 weeks later, the catheter fell out, per the patient; however, his family reports he pulled it out accidentally. He was again supposed to follow up with urology but has been unable to see them. Since that time, his urinary symptoms and obstructive symptoms have continued. He has had issues with increasing sugars and difficult to control diabetes. His most recent A1c was 12.4 in August. He was admitted from [facility] urgent care center at the end of August with, again, quite high sugars and evidence of some possible sepsis. Cultures since that time have been growing yeast. He was discharged on oral Keflex recently; however, with more aggressive control of his sugars, he was found unresponsive this morning and admitted through EMS with hypoglycemia and hypotension.    This afternoon, he is actually quite oriented and is eating. His blood pressure has been stable. He had to have a urinary catheter replaced due to some ongoing urinary retention.   PAST MEDICAL HISTORY: 1.  Diabetes, poorly controlled.  2.  Iron deficiency anemia.  3.  Hyperlipidemia.  4.  GERD.  SOCIAL HISTORY: The patient is retired. He does not smoke or drink. Children are involved in his care.   FAMILY HISTORY: Father died in a motor vehicle accident. Mother died of old age.   REVIEW OF SYSTEMS:  Eleven systems reviewed and negative except as per HPI.   PHYSICAL EXAMINATION:  GENERAL: The patient is sitting up in bed. He is in no acute distress. He is eating. He is verbalizing well.  Family is at the bedside.   VITAL SIGNS: Temperature 98.6, pulse 78, blood pressure 127/65.  HEENT: His pupils equal, round and reactive to light and accommodation. Extraocular movements are intact. Sclerae anicteric. Oropharynx is clear. NECK: Supple. HEART: Regular. LUNGS: Clear to auscultation.  ABDOMEN: Soft, nontender, nondistended. No hepatosplenomegaly.  GENITOURINARY:   He has a Foley catheter in place with dark color urine.  EXTREMITIES: No clubbing, cyanosis or edema.  NEUROLOGIC:  He is alert and oriented x 3. Grossly nonfocal neuro exam.   LABORATORY, DIAGNOSTIC AND RADIOLOGICAL DATA: Most recent labs showed a white blood count 6.4, hemoglobin 11.3 and platelet of 254. Renal function showed a creatinine of 0.8. Urinalysis done on 09/02 shows large leukocyte esterase, too numerous to count white cells. Urine culture done several days prior grew yeast. Urinalysis at  that time again showed 30 to 40 white cells. This was collected August 28th. Of note, urine culture done in the hospital isolated Candida krusei.   IMPRESSION: An 81 year old gentleman with poorly controlled diabetes. He has had issues with recurrent urinary incontinence for over 1 year. He was initially followed by Dr. Ernst Spell,  who referred him to me. At that time a year ago, we were able to monitor him with amoxicillin and cleared his enterococcal urinary tract infections. His symptoms have recurred now for the last 3 months. He has had enterococcus and currently has Candida krusei. He has had issues with emptying his bladder and has had to have a Foley catheter inserted twice in the last several months. Catheter is now back in due to some postvoid residuals.   RECOMMENDATIONS: 1.  At this point, I am not sure if he septic with his urinary symptoms. I believe he is likely hypoglycemic from getting too much insulin.  2.  Recommend we continue the ceftriaxone and continue the micafungin for the Candida krusei.  3.  I suggest we consult urology and I put that in to help assess why he has these recurrent urinary infections. He is also having great difficulty voiding at times, which is likely contributing to the infections. He likely will need a cystoscopy and further imaging of his genitourinary system.  4.  For his diabetes, he follows with Dr. Gabriel Carina and I put in a consult for her.     I will be glad to follow with you. Thanks for the consult   ____________________________ Cheral Marker. Ola Spurr, MD dpf:dmm D: 05/05/2013 20:30:00 ET T: 05/05/2013 21:06:39 ET JOB#: [id number]  cc: Cheral Marker. Ola Spurr, MD, <Dictator> Isac Ola Spurr MD ELECTRONICALLY SIGNED 05/09/2013 20:43

## 2014-12-23 NOTE — Consult Note (Signed)
Chief Complaint and History:  Referring Physician Dr. Allena KatzPatel   Chief Complaint hypoglycemia   Allergies:  Ativan: Other  Penicillin: Rash  Assessment/Plan:  Assessment/Plan 81 yo F with type 2 diabetes admitted for DKA 8/29-8/31, sent out on insulin and having lows at homes over last 2 weeks. Early this AM found down at home with AMS and BG 25. Was treated, monitored, sugars now nml and AMS resolved.   A/ Type 2 diabetes, uncontrolled Recurrent UTI  P/ Avoid hypoglycemia causing agents (insulin & sulfonylureas) Encourage good nutrition Will add Glucerna to meal trays Start metformin 500 mg bid Will likely add back pioglitazone in upcoming days, if sugars remain high  Full consult to be dictated.   Case Discussed With patient, family   Electronic Signatures: Raj JanusSolum, Jet Traynham M (MD)  (Signed 03-Sep-14 17:42)  Authored: Chief Complaint and History, ALLERGIES, Assessment/Plan   Last Updated: 03-Sep-14 17:42 by Raj JanusSolum, Eri Platten M (MD)

## 2014-12-24 NOTE — Discharge Summary (Signed)
PATIENT NAME:  Carlos Gordon, Carlos Gordon MR#:  [id number] DATE OF BIRTH:  1924/07/05  DATE OF ADMISSION:  11/26/2013 DATE OF DISCHARGE:  11/26/2013  DISCHARGE DIAGNOSES: 1.  Acute systolic dysfunction. 2.  Congestive heart failure.  3.  Severe critical aortic valve stenosis.  4.  Left ventricular systolic dysfunction with coronary artery disease.   HISTORY OF PRESENT ILLNESS: This is an 81 year old male with known cardiovascular disease having progressive episodes of congestive heart failure, acute systolic with cardiac catheterization showing moderate segmental LV systolic dysfunction with inferior hypokinesis, severe mitral regurgitation with mild to moderate pulmonary hypertension, severe aortic valve stenosis and moderate coronary artery disease. The patient was felt that he would best be served with surgical intervention and therefore the patient wished to be transferred for further treatment of this issue. The patient was in [facility] and treatment options of above. ____________________________ Lamar BlinksBruce J. Deissy Guilbert, MD bjk:aw D: 12/06/2013 12:49:55 ET T: 12/06/2013 14:12:39 ET JOB#: [id number]  cc: Lamar BlinksBruce J. Ayliana Casciano, MD, <Dictator> Lamar BlinksBRUCE J Macoy Rodwell MD ELECTRONICALLY SIGNED 12/08/2013 8:25

## 2015-01-20 IMAGING — CR DG CHEST 2V
2 series · 2 of 2 positions shown · non-contrast
Comparison: Chest x-ray of 05/05/2013

CLINICAL DATA: Followup pneumonia, cough

EXAM:
CHEST  2 VIEW

[chest pa]
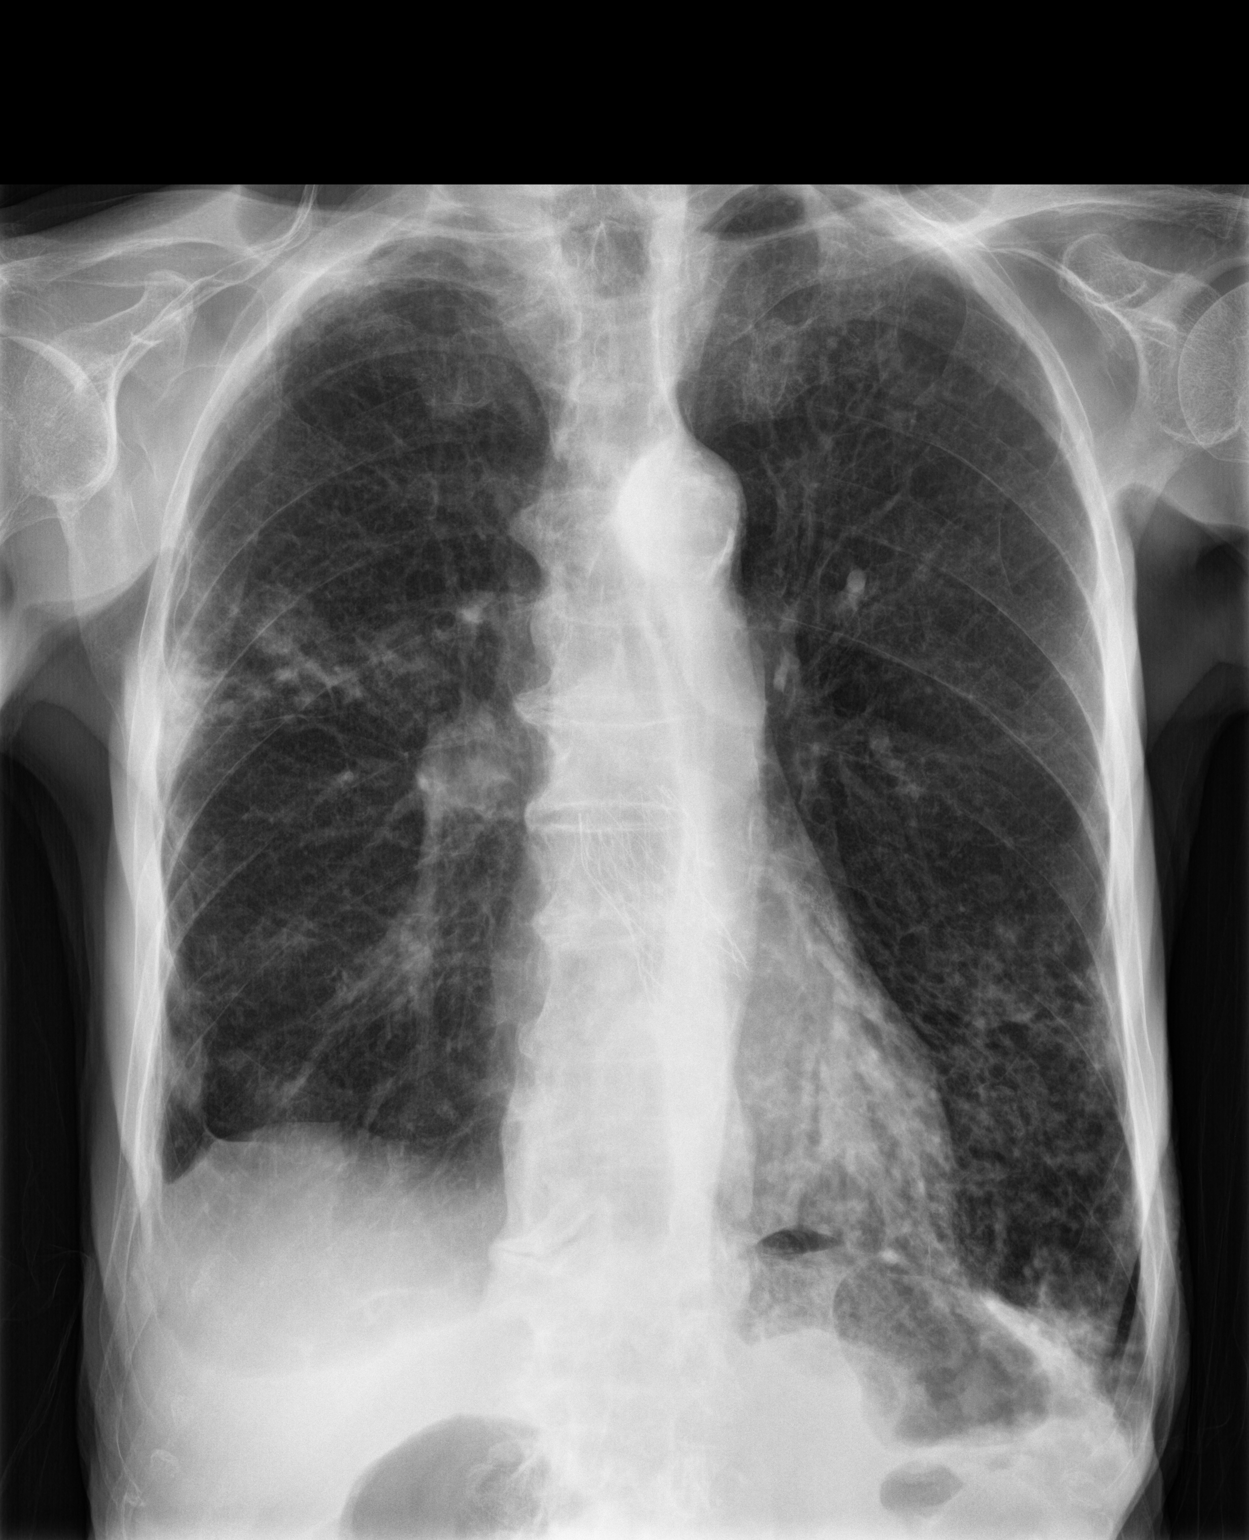

[chest lat]
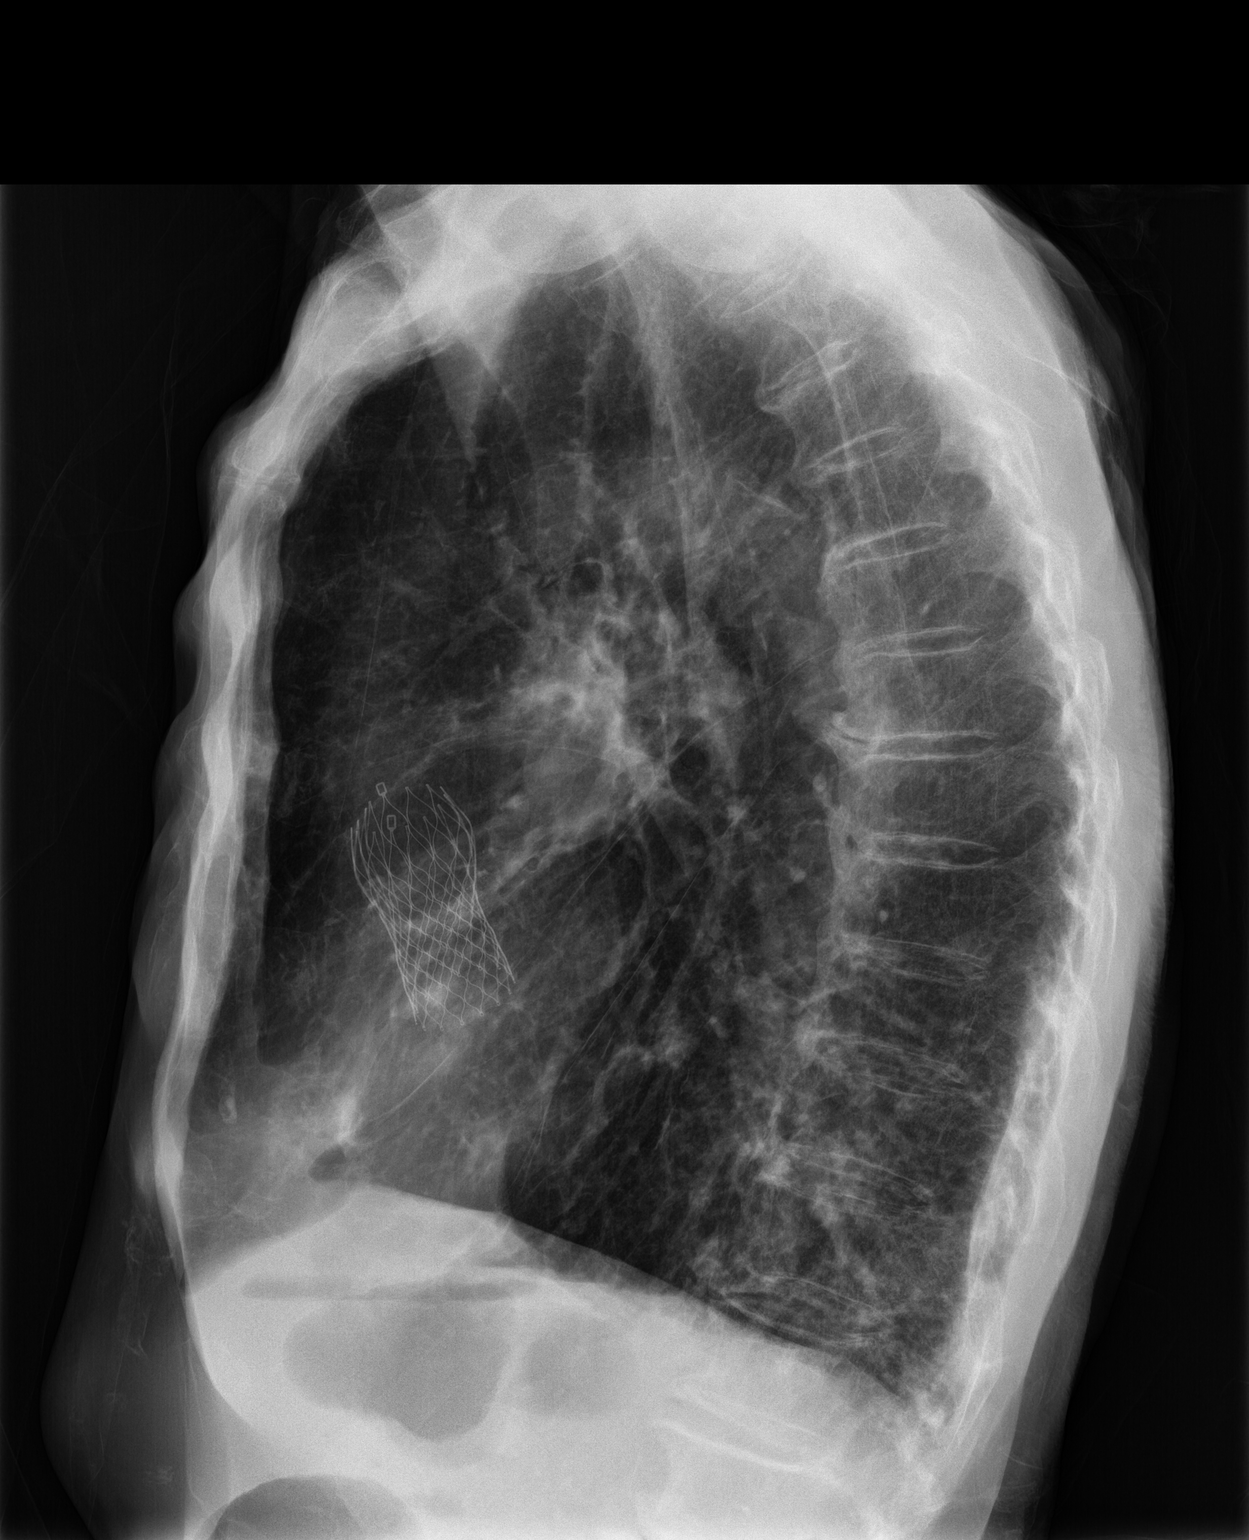

[2 of 2 positions shown; findings below may reference images not displayed]

FINDINGS: There are prominent and coarse markings in the left lower lobe with
somewhat nodular prominent markings in the right mid lung possibly
in the right upper lobe. These findings are most consistent with
pneumonia, although some of these changes could be chronic. Direct
comparison with the prior outside chest x-ray may be helpful to
assess change. The lungs are hyperaerated with flattened
hemidiaphragms and increased AP diameter consistent with emphysema.
No effusion is seen. Mediastinal and hilar contours are
unremarkable. The heart is within normal limits in size. A metallic
stent device is noted in the region of the ascending aorta. There
are diffuse degenerative changes throughout the thoracic spine.
IMPRESSION: 1. Prominent coarse opacities throughout the left lower lobe most
consistent with pneumonia with probable lesser involvement of the
right mid upper lung field. Comparison with the prior outside chest
x-ray would be helpful to assess change.
2. Emphysema.
